# Patient Record
Sex: Female | Born: 1955 | Race: Black or African American | Hispanic: No | Smoking: Never smoker
Health system: Southern US, Community
[De-identification: ages and names within clinical notes are randomized; demographics above are authoritative.]

## PROBLEM LIST (undated history)

## (undated) DIAGNOSIS — R200 Anesthesia of skin: Secondary | ICD-10-CM

## (undated) DIAGNOSIS — K219 Gastro-esophageal reflux disease without esophagitis: Secondary | ICD-10-CM

## (undated) DIAGNOSIS — R739 Hyperglycemia, unspecified: Secondary | ICD-10-CM

## (undated) HISTORY — DX: Gastro-esophageal reflux disease without esophagitis: K21.9

## (undated) HISTORY — PX: ABDOMINAL HYSTERECTOMY: SHX81

## (undated) HISTORY — DX: Anesthesia of skin: R20.0

## (undated) HISTORY — PX: EYE SURGERY: SHX253

## (undated) HISTORY — DX: Hyperglycemia, unspecified: R73.9

---

## 2004-03-17 ENCOUNTER — Encounter: Admission: RE | Admit: 2004-03-17 | Discharge: 2004-03-17 | Payer: Self-pay | Admitting: Family Medicine

## 2005-05-05 ENCOUNTER — Encounter: Admission: RE | Admit: 2005-05-05 | Discharge: 2005-05-05 | Payer: Self-pay | Admitting: Family Medicine

## 2006-10-12 ENCOUNTER — Ambulatory Visit: Payer: Self-pay | Admitting: Gastroenterology

## 2009-06-19 ENCOUNTER — Encounter: Payer: Self-pay | Admitting: Gastroenterology

## 2009-06-25 ENCOUNTER — Encounter (INDEPENDENT_AMBULATORY_CARE_PROVIDER_SITE_OTHER): Payer: Self-pay | Admitting: *Deleted

## 2009-07-09 ENCOUNTER — Encounter (INDEPENDENT_AMBULATORY_CARE_PROVIDER_SITE_OTHER): Payer: Self-pay | Admitting: *Deleted

## 2009-07-17 ENCOUNTER — Ambulatory Visit: Payer: Self-pay | Admitting: Gastroenterology

## 2009-07-24 ENCOUNTER — Ambulatory Visit: Payer: Self-pay | Admitting: Gastroenterology

## 2009-07-25 ENCOUNTER — Encounter: Payer: Self-pay | Admitting: Gastroenterology

## 2009-08-26 ENCOUNTER — Ambulatory Visit (HOSPITAL_COMMUNITY)
Admission: RE | Admit: 2009-08-26 | Discharge: 2009-08-27 | Payer: Self-pay | Source: Home / Self Care | Admitting: Ophthalmology

## 2010-05-26 NOTE — Letter (Signed)
Summary: [facility]  [facility]   Imported By: Lester [place] 07/29/2009 10:53:26  _____________________________________________________________________  External Attachment:    Type:   Image     Comment:   External Document

## 2010-05-26 NOTE — Letter (Signed)
Summary: Previsit letter  [facility]  [address] [place], [place] [address]   Phone: [phone]  Fax: [phone]       06/25/2009 MRN: [id number]  Julia James [address] [place], [place]  [address]  Dear Ms. Grabski,  Welcome to the Gastroenterology Division at [facility].    You are scheduled to see a nurse for your pre-procedure visit on 07-10-09 at 10:00a.m. on the 3rd floor at [facility], [address]  We ask that you try to arrive at our office 15 minutes prior to your appointment time to allow for check-in.  Your nurse visit will consist of discussing your medical and surgical history, your immediate family medical history, and your medications.    Please bring a complete list of all your medications or, if you prefer, bring the medication bottles and we will list them.  We will need to be aware of both prescribed and over the counter drugs.  We will need to know exact dosage information as well.  If you are on blood thinners (Coumadin, Plavix, Aggrenox, Ticlid, etc.) please call our office today/prior to your appointment, as we need to consult with your physician about holding your medication.   Please be prepared to read and sign documents such as consent forms, a financial agreement, and acknowledgement forms.  If necessary, and with your consent, a friend or relative is welcome to sit-in on the nurse visit with you.  Please bring your insurance card so that we may make a copy of it.  If your insurance requires a referral to see a specialist, please bring your referral form from your primary care physician.  No co-pay is required for this nurse visit.     If you cannot keep your appointment, please call [phone] to cancel or reschedule prior to your appointment date.  This allows [place] the opportunity to schedule an appointment for another patient in need of care.    Thank you for choosing [facility] for your medical  needs.  We appreciate the opportunity to care for you.  Please visit [place] at our website  to learn more about our practice.                     Sincerely.                                                                                                                   The Gastroenterology Division

## 2010-05-26 NOTE — Letter (Signed)
Summary: [facility] Instructions  [facility]  [address] [place], [place] [address]   Phone: [phone]  Fax: [phone]       Julia James    Sep 11, 1955    MRN: [id number]        Procedure Day /Date:  Thursday 07/24/2009     Arrival Time:  9:30 am      Procedure Time: 10:30 am     Location of Procedure:                    _x _  [facility] (4th Floor)   PREPARATION FOR COLONOSCOPY WITH MOVIPREP   Starting 5 days prior to your procedure  Saturday 3/26 do not eat nuts, seeds, popcorn, corn, beans, peas,  salads, or any raw vegetables.  Do not take any fiber supplements (e.g. Metamucil, Citrucel, and Benefiber).  THE DAY BEFORE YOUR PROCEDURE         DATE: Wednesday 3/30  1.  Drink clear liquids the entire day-NO SOLID FOOD  2.  Do not drink anything colored red or purple.  Avoid juices with pulp.  No orange juice.  3.  Drink at least 64 oz. (8 glasses) of fluid/clear liquids during the day to prevent dehydration and help the prep work efficiently.  CLEAR LIQUIDS INCLUDE: Water Jello Ice Popsicles Tea (sugar ok, no milk/cream) Powdered fruit flavored drinks Coffee (sugar ok, no milk/cream) Gatorade Juice: apple, white grape, white cranberry  Lemonade Clear bullion, consomm, broth Carbonated beverages (any kind) Strained chicken noodle soup Hard Candy                             4.  In the morning, mix first dose of MoviPrep solution:    Empty 1 Pouch A and 1 Pouch B into the disposable container    Add lukewarm drinking water to the top line of the container. Mix to dissolve    Refrigerate (mixed solution should be used within 24 hrs)  5.  Begin drinking the prep at 5:00 p.m. The MoviPrep container is divided by 4 marks.   Every 15 minutes drink the solution down to the next mark (approximately 8 oz) until the full liter is complete.   6.  Follow completed prep with 16 oz of clear liquid of your choice (Nothing red or  purple).  Continue to drink clear liquids until bedtime.  7.  Before going to bed, mix second dose of MoviPrep solution:    Empty 1 Pouch A and 1 Pouch B into the disposable container    Add lukewarm drinking water to the top line of the container. Mix to dissolve    Refrigerate  THE DAY OF YOUR PROCEDURE      DATE:Thursday 3/31  Beginning at 5:30 a.m. (5 hours before procedure):         1. Every 15 minutes, drink the solution down to the next mark (approx 8 oz) until the full liter is complete.  2. Follow completed prep with 16 oz. of clear liquid of your choice.    3. You may drink clear liquids until 8:30 am (2 HOURS BEFORE PROCEDURE).   MEDICATION INSTRUCTIONS  Unless otherwise instructed, you should take regular prescription medications with a small sip of water   as early as possible the morning of your procedure.         OTHER INSTRUCTIONS  You will need a responsible adult at  least 54 years of age to accompany you and drive you home.   This person must remain in the waiting room during your procedure.  Wear loose fitting clothing that is easily removed.  Leave jewelry and other valuables at home.  However, you may wish to bring a book to read or  an iPod/MP3 player to listen to music as you wait for your procedure to start.  Remove all body piercing jewelry and leave at home.  Total time from sign-in until discharge is approximately 2-3 hours.  You should go home directly after your procedure and rest.  You can resume normal activities the  day after your procedure.  The day of your procedure you should not:   Drive   Make legal decisions   Operate machinery   Drink alcohol   Return to work  You will receive specific instructions about eating, activities and medications before you leave.    The above instructions have been reviewed and explained to me by   ____Suzanne Yetta Flock, RN___________________    I fully understand and can verbalize  these instructions _____________________________ Date _________

## 2010-05-26 NOTE — Miscellaneous (Signed)
Summary: screening colon/ks  Clinical Lists Changes  Medications: Added new medication of MOVIPREP 100 GM  SOLR (PEG-KCL-NACL-NASULF-NA ASC-C) As directed - Signed Rx of MOVIPREP 100 GM  SOLR (PEG-KCL-NACL-NASULF-NA ASC-C) As directed;  #1 x 0;  Signed;  Entered by: Clide Cliff RN;  Authorized by: Louis Meckel MD;  Method used: Electronically to [facility]  [address] [id number] [address] [place], [place]  [address], Ph: [phone] or [phone], Fax: [phone] Observations: Added new observation of ALLERGY REV: Done (07/17/2009 9:11)    Prescriptions: MOVIPREP 100 GM  SOLR (PEG-KCL-NACL-NASULF-NA ASC-C) As directed  #1 x 0   Entered by:   Clide Cliff RN   Authorized by:   Louis Meckel MD   Signed by:   Clide Cliff RN on 07/17/2009   Method used:   Electronically to        [facility]  [address] [id number] ([facility]ail)       [address]       [place], [place]  [address]       Ph: [phone] or [phone]       Fax: [phone]   RxID:   [id number]

## 2010-05-26 NOTE — Procedures (Signed)
Summary: Colonoscopy  Patient: Julia James Note: All result statuses are Final unless otherwise noted.  Tests: (1) Colonoscopy (COL)   COL Colonoscopy           DONE     [facility]     [address]     [place], [place]  [address]           COLONOSCOPY PROCEDURE REPORT           PATIENT:  [name], [name]  MR#:  [id number]     BIRTHDATE:  [birth year], 54 yrs. old  GENDER:  female     ENDOSCOPIST:  Barbette Hair. Arlyce Dice, MD     REF. BY:     PROCEDURE DATE:  07/24/2009     PROCEDURE:  Colonoscopy with snare polypectomy     ASA CLASS:  Class I     INDICATIONS:  Routine Risk Screening     MEDICATIONS:   Fentanyl 100 mcg IV, Versed 10 mg IV           DESCRIPTION OF PROCEDURE:   After the risks benefits and     alternatives of the procedure were thoroughly explained, informed     consent was obtained.  Digital rectal exam was performed and     revealed no abnormalities.   The LB CF-[id number] endoscope     was introduced through the anus and advanced to the cecum, which     was identified by the ileocecal valve, without limitations.  The     quality of the prep was excellent, using MoviPrep.  The instrument     was then slowly withdrawn as the colon was fully examined.     <<PROCEDUREIMAGES>>           FINDINGS:  A sessile polyp was found in the recto-sigmoid colon.     It was 3 mm in size. It was found 9 cm from the point of entry.     Polyp was snared without cautery. Retrieval was unsuccessful.     snare polyp Unable to locate snared polyp. Base of polyp was bxed     and submitted to pathology (see image15). Mucosal surface of polyp     had features of an adenomatous polyp  This was otherwise a normal     examination of the colon (see image1, image2, image3, image5,     image6, image7, image8, image10, image13, and image16).     Retroflexed views in the rectum revealed no abnormalities.    The     scope was then withdrawn from the patient and the procedure  completed.           COMPLICATIONS:  None     ENDOSCOPIC IMPRESSION:     1) 3 mm sessile polyp in the recto-sigmoid colon     2) Otherwise normal examination     RECOMMENDATIONS:     1) colonoscopy in 5 years     REPEAT EXAM:  In 5 year(s) for Colonoscopy.           ______________________________     Barbette Hair. Arlyce Dice, MD           CC:  Charlesetta Shanks, Md           n.     eSIGNED:   Barbette Hair. [name] at 07/24/2009 11:24 AM           [name], [id number]  Note: An exclamation mark (!) indicates a result that was not dispersed into  the flowsheet. Document Creation Date: 07/24/2009 11:24 AM _______________________________________________________________________  (1) Order result status: Final Collection or observation date-time: 07/24/2009 11:17 Requested date-time:  Receipt date-time:  Reported date-time:  Referring Physician:   Ordering Physician: Melvia Heaps [phone]) Specimen Source:  Source: [name] Order Number: [id number] Lab site:   Appended Document: Colonoscopy     Procedures Next Due Date:    Colonoscopy: 06/2014

## 2010-05-26 NOTE — Letter (Signed)
Summary: Results Letter  [facility]  [address] [place], [place] [address]   Phone: [phone]  Fax: [phone]        July 25, 2009 MRN: [id number]    LASHONE STAUBER [address] [place], [place]  [address]    Dear Ms. Heart,  As you may recall, colonoscopy revealed 2 small polyps.  These are noncancerous polyps.  I recommend that you repeat this exam in 5 years.  Please feel free to contact me if you have any questions.  Sincerely,  Barbette Hair. Arlyce Dice, M.D., [facility]         Sincerely,  Louis Meckel MD  This letter has been electronically signed by your physician.  Appended Document: Results Letter Letter mailed 4.5.11

## 2010-07-14 LAB — CBC
HCT: 33.9 % — ABNORMAL LOW (ref 36.0–46.0)
Hemoglobin: 11.5 g/dL — ABNORMAL LOW (ref 12.0–15.0)
MCHC: 33.9 g/dL (ref 30.0–36.0)
MCV: 81.5 fL (ref 78.0–100.0)
Platelets: 142 10*3/uL — ABNORMAL LOW (ref 150–400)
RBC: 4.17 MIL/uL (ref 3.87–5.11)
RDW: 13.8 % (ref 11.5–15.5)
WBC: 4 10*3/uL (ref 4.0–10.5)

## 2010-11-24 ENCOUNTER — Emergency Department (HOSPITAL_COMMUNITY)
Admission: EM | Admit: 2010-11-24 | Discharge: 2010-11-24 | Disposition: A | Discharge status: Home / Self Care | Payer: Worker's Compensation | Attending: Emergency Medicine | Admitting: Emergency Medicine

## 2010-11-24 DIAGNOSIS — S0083XA Contusion of other part of head, initial encounter: Secondary | ICD-10-CM | POA: Insufficient documentation

## 2010-11-24 DIAGNOSIS — Y9210 Unspecified residential institution as the place of occurrence of the external cause: Secondary | ICD-10-CM | POA: Insufficient documentation

## 2010-11-24 DIAGNOSIS — R51 Headache: Secondary | ICD-10-CM | POA: Insufficient documentation

## 2010-11-24 DIAGNOSIS — S0003XA Contusion of scalp, initial encounter: Secondary | ICD-10-CM | POA: Insufficient documentation

## 2010-11-24 DIAGNOSIS — Y990 Civilian activity done for income or pay: Secondary | ICD-10-CM | POA: Insufficient documentation

## 2011-05-21 ENCOUNTER — Encounter (INDEPENDENT_AMBULATORY_CARE_PROVIDER_SITE_OTHER): Payer: Worker's Compensation | Admitting: Ophthalmology

## 2011-06-08 ENCOUNTER — Ambulatory Visit (INDEPENDENT_AMBULATORY_CARE_PROVIDER_SITE_OTHER): Payer: BC Managed Care – PPO | Admitting: Family Medicine

## 2011-06-08 ENCOUNTER — Ambulatory Visit (INDEPENDENT_AMBULATORY_CARE_PROVIDER_SITE_OTHER): Payer: BC Managed Care – PPO | Admitting: Ophthalmology

## 2011-06-08 ENCOUNTER — Encounter: Payer: Self-pay | Admitting: Family Medicine

## 2011-06-08 DIAGNOSIS — H33009 Unspecified retinal detachment with retinal break, unspecified eye: Secondary | ICD-10-CM

## 2011-06-08 DIAGNOSIS — H2510 Age-related nuclear cataract, unspecified eye: Secondary | ICD-10-CM

## 2011-06-08 DIAGNOSIS — H538 Other visual disturbances: Secondary | ICD-10-CM

## 2011-06-08 DIAGNOSIS — J069 Acute upper respiratory infection, unspecified: Secondary | ICD-10-CM

## 2011-06-08 DIAGNOSIS — H43819 Vitreous degeneration, unspecified eye: Secondary | ICD-10-CM

## 2011-06-08 DIAGNOSIS — H66009 Acute suppurative otitis media without spontaneous rupture of ear drum, unspecified ear: Secondary | ICD-10-CM

## 2011-06-08 DIAGNOSIS — H6690 Otitis media, unspecified, unspecified ear: Secondary | ICD-10-CM

## 2011-06-08 MED ORDER — FLUTICASONE PROPIONATE 50 MCG/ACT NA SUSP: NASAL | Status: DC

## 2011-06-08 MED ORDER — AMOXICILLIN 500 MG PO CAPS: 500.0000 mg | ORAL_CAPSULE | Freq: Three times a day (TID) | ORAL | Status: AC

## 2011-06-08 NOTE — Progress Notes (Signed)
  Subjective:    Patient ID: Julia James, female    DOB: June 08, 1955, 56 y.o.   MRN: [id number]  HPI Patient works at [employer] home. She has been getting ill for the last couple of days. She has facial pain and headache and congestion. She has a little pain going into her left ear now. There's a slight cough but it is not bad. No abdominal complaints.   Review of Systems Unremarkable    Objective:   Physical Exam Middle-aged [place] female who looks like she just doesn't feel well, looks fatigued. TMs normal on the right but the left has a little injection in the lower anterior third of the TM. Her throat was clear nose congested. Has a little tenderness of her sinuses. Neck supple without significant nodes. Chest is clear to auscultation. Heart regular without murmurs.       Assessment & Plan:  URI with early left otitis and headache  See the written discharge instruction sheet

## 2011-06-08 NOTE — Patient Instructions (Signed)
Patient is encouraged to drink lots of fluids and get enough rest. She can take Tylenol or aspirin for aching and headache. Am prescribing a nose spray to try and open up the sinuses and eustachian tubes, as well as an antibiotic for her. She is to let me know if she is not doing better. We will keep her off of work tomorrow   Upper Respiratory Infection, Adult An upper respiratory infection (URI) is also known as the common cold. It is often caused by a type of germ (virus). Colds are easily spread (contagious). You can pass it to others by kissing, coughing, sneezing, or drinking out of the same glass. Usually, you get better in 1 or 2 weeks.  HOME CARE   Only take medicine as told by your doctor.   Use a warm mist humidifier or breathe in steam from a hot shower.   Drink enough water and fluids to keep your pee (urine) clear or pale yellow.   Get plenty of rest.   Return to work when your temperature is back to normal or as told by your doctor. You may use a face mask and wash your hands to stop your cold from spreading.  GET HELP RIGHT AWAY IF:   After the first few days, you feel you are getting worse.   You have questions about your medicine.   You have chills, shortness of breath, or brown or red spit (mucus).   You have yellow or brown snot (nasal discharge) or pain in the face, especially when you bend forward.   You have a fever, puffy (swollen) neck, pain when you swallow, or white spots in the back of your throat.   You have a bad headache, ear pain, sinus pain, or chest pain.   You have a high-pitched whistling sound when you breathe in and out (wheezing).   You have a lasting cough or cough up blood.   You have sore muscles or a stiff neck.  MAKE SURE YOU:   Understand these instructions.   Will watch your condition.   Will get help right away if you are not doing well or get worse.  Document Released: 09/29/2007 Document Revised: 12/23/2010 Document Reviewed:  08/17/2010 [facility] [place] [facility] Patient Information 2012 [place], [place].

## 2011-12-07 ENCOUNTER — Ambulatory Visit (INDEPENDENT_AMBULATORY_CARE_PROVIDER_SITE_OTHER): Payer: Worker's Compensation | Admitting: Ophthalmology

## 2011-12-14 ENCOUNTER — Ambulatory Visit (INDEPENDENT_AMBULATORY_CARE_PROVIDER_SITE_OTHER): Payer: Worker's Compensation | Admitting: Ophthalmology

## 2011-12-30 ENCOUNTER — Ambulatory Visit (INDEPENDENT_AMBULATORY_CARE_PROVIDER_SITE_OTHER): Payer: Worker's Compensation | Admitting: Ophthalmology

## 2012-07-31 ENCOUNTER — Encounter (INDEPENDENT_AMBULATORY_CARE_PROVIDER_SITE_OTHER): Payer: BC Managed Care – PPO | Admitting: Ophthalmology

## 2012-07-31 DIAGNOSIS — H209 Unspecified iridocyclitis: Secondary | ICD-10-CM

## 2012-07-31 DIAGNOSIS — H538 Other visual disturbances: Secondary | ICD-10-CM

## 2012-07-31 DIAGNOSIS — H43819 Vitreous degeneration, unspecified eye: Secondary | ICD-10-CM

## 2012-07-31 DIAGNOSIS — H33009 Unspecified retinal detachment with retinal break, unspecified eye: Secondary | ICD-10-CM

## 2014-07-05 ENCOUNTER — Encounter: Payer: Self-pay | Admitting: Gastroenterology

## 2015-08-29 ENCOUNTER — Encounter: Payer: Self-pay | Admitting: Gastroenterology

## 2016-08-17 DIAGNOSIS — M722 Plantar fascial fibromatosis: Secondary | ICD-10-CM | POA: Diagnosis not present

## 2016-08-17 DIAGNOSIS — J069 Acute upper respiratory infection, unspecified: Secondary | ICD-10-CM | POA: Diagnosis not present

## 2016-08-31 DIAGNOSIS — M79671 Pain in right foot: Secondary | ICD-10-CM | POA: Diagnosis not present

## 2016-08-31 DIAGNOSIS — K210 Gastro-esophageal reflux disease with esophagitis: Secondary | ICD-10-CM | POA: Diagnosis not present

## 2016-08-31 DIAGNOSIS — K5900 Constipation, unspecified: Secondary | ICD-10-CM | POA: Diagnosis not present

## 2016-08-31 DIAGNOSIS — Z5181 Encounter for therapeutic drug level monitoring: Secondary | ICD-10-CM | POA: Diagnosis not present

## 2016-08-31 DIAGNOSIS — R739 Hyperglycemia, unspecified: Secondary | ICD-10-CM | POA: Diagnosis not present

## 2016-11-11 DIAGNOSIS — Z1231 Encounter for screening mammogram for malignant neoplasm of breast: Secondary | ICD-10-CM | POA: Diagnosis not present

## 2017-03-04 DIAGNOSIS — J209 Acute bronchitis, unspecified: Secondary | ICD-10-CM | POA: Diagnosis not present

## 2018-05-01 DIAGNOSIS — J069 Acute upper respiratory infection, unspecified: Secondary | ICD-10-CM | POA: Diagnosis not present

## 2018-05-05 DIAGNOSIS — Z1231 Encounter for screening mammogram for malignant neoplasm of breast: Secondary | ICD-10-CM | POA: Diagnosis not present

## 2018-05-08 DIAGNOSIS — Z0000 Encounter for general adult medical examination without abnormal findings: Secondary | ICD-10-CM | POA: Diagnosis not present

## 2018-05-08 DIAGNOSIS — Z136 Encounter for screening for cardiovascular disorders: Secondary | ICD-10-CM | POA: Diagnosis not present

## 2018-05-08 DIAGNOSIS — Z5181 Encounter for therapeutic drug level monitoring: Secondary | ICD-10-CM | POA: Diagnosis not present

## 2018-05-08 DIAGNOSIS — R739 Hyperglycemia, unspecified: Secondary | ICD-10-CM | POA: Diagnosis not present

## 2019-06-04 ENCOUNTER — Other Ambulatory Visit (HOSPITAL_BASED_OUTPATIENT_CLINIC_OR_DEPARTMENT_OTHER): Payer: Self-pay | Admitting: Family Medicine

## 2019-06-04 ENCOUNTER — Ambulatory Visit (HOSPITAL_BASED_OUTPATIENT_CLINIC_OR_DEPARTMENT_OTHER): Admission: RE | Admit: 2019-06-04 | Payer: PRIVATE HEALTH INSURANCE | Source: Ambulatory Visit

## 2019-06-04 DIAGNOSIS — M79605 Pain in left leg: Secondary | ICD-10-CM

## 2019-06-05 ENCOUNTER — Ambulatory Visit (HOSPITAL_BASED_OUTPATIENT_CLINIC_OR_DEPARTMENT_OTHER)
Admission: RE | Admit: 2019-06-05 | Discharge: 2019-06-05 | Disposition: A | Discharge status: Home / Self Care | Payer: PRIVATE HEALTH INSURANCE | Source: Ambulatory Visit | Attending: Family Medicine | Admitting: Family Medicine

## 2019-06-05 ENCOUNTER — Other Ambulatory Visit: Payer: Self-pay

## 2019-06-05 ENCOUNTER — Encounter (HOSPITAL_BASED_OUTPATIENT_CLINIC_OR_DEPARTMENT_OTHER): Payer: Self-pay

## 2019-06-05 ENCOUNTER — Other Ambulatory Visit (HOSPITAL_BASED_OUTPATIENT_CLINIC_OR_DEPARTMENT_OTHER): Payer: Self-pay | Admitting: Family Medicine

## 2019-06-05 DIAGNOSIS — M79605 Pain in left leg: Secondary | ICD-10-CM | POA: Insufficient documentation

## 2019-06-05 DIAGNOSIS — Z1231 Encounter for screening mammogram for malignant neoplasm of breast: Secondary | ICD-10-CM | POA: Diagnosis present

## 2019-10-10 ENCOUNTER — Other Ambulatory Visit: Payer: Self-pay

## 2019-10-10 ENCOUNTER — Encounter: Payer: Self-pay | Admitting: Neurology

## 2019-10-10 ENCOUNTER — Ambulatory Visit: Payer: PRIVATE HEALTH INSURANCE | Admitting: Neurology

## 2019-10-10 ENCOUNTER — Telehealth: Payer: Self-pay | Admitting: Neurology

## 2019-10-10 VITALS — BP 143/83 | HR 62 | Ht 62.0 in | Wt 229.5 lb

## 2019-10-10 DIAGNOSIS — M5442 Lumbago with sciatica, left side: Secondary | ICD-10-CM | POA: Diagnosis not present

## 2019-10-10 DIAGNOSIS — M5441 Lumbago with sciatica, right side: Secondary | ICD-10-CM | POA: Diagnosis not present

## 2019-10-10 DIAGNOSIS — R202 Paresthesia of skin: Secondary | ICD-10-CM | POA: Diagnosis not present

## 2019-10-10 MED ORDER — DULOXETINE HCL 60 MG PO CPEP: 60.0000 mg | ORAL_CAPSULE | Freq: Every day | ORAL | 11 refills | Status: DC

## 2019-10-10 NOTE — Progress Notes (Addendum)
PATIENT: Julia James DOB: 1955-06-01  Chief Complaint  Patient presents with  . Numbness    Reports intermittent numbness/pain in her bilateral legs and feet. Symptoms starting in February 2020. She is taking gabapentin 300mg  at bedtime without much benefit.  PCP    Marland Kitchen, MD     HISTORICAL  Julia James is a 64 year old female, seen in request by her primary care physician Dr. 64, [place] Charon P for evaluation of bilateral feet paresthesia  I reviewed and summarized the referring note. She has past medical history of acid reflux, prediabetes, obesity,  Since February 2021, she noticed gradual onset bilateral feet numbness tingling, starting at the plantar surface, quickly ascending to bilateral ankle level, sometimes to knee level, she described numbness tingling, occasionally woke her up from sleep, she denies bowel and bladder incontinence, has chronic pain, but no significant radiating pain,  She also complains of bilateral hands paresthesia, was seen by hand surgeon, was given the diagnosis of possible carpal tunnel syndromes   REVIEW OF SYSTEMS: Full 14 system review of systems performed and notable only for as above All other review of systems were negative.  ALLERGIES: No Known Allergies  HOME MEDICATIONS: Current Outpatient Medications  Medication Sig Dispense Refill  . aspirin EC 81 MG tablet Take 81 mg by mouth as needed.     . gabapentin (NEURONTIN) 300 MG capsule Take 300 mg by mouth at bedtime.    . meloxicam (MOBIC) 15 MG tablet Take 15 mg by mouth as needed for pain.     No current facility-administered medications for this visit.    PAST MEDICAL HISTORY: Past Medical History:  Diagnosis Date  . GERD (gastroesophageal reflux disease)   . Hyperglycemia   . Numbness     PAST SURGICAL HISTORY: Past Surgical History:  Procedure Laterality Date  . ABDOMINAL HYSTERECTOMY    . EYE SURGERY Left     FAMILY HISTORY: Family  History  Problem Relation Age of Onset  . Healthy Mother   . Hypertension Father   . Diabetes Father     SOCIAL HISTORY: Social History   Socioeconomic History  . Marital status: Widowed    Spouse name: Not on file  . Number of children: 3  . Years of education: college  . Highest education level: Not on file  Occupational History  . Occupation: CMA  Tobacco Use  . Smoking status: Never Smoker  . Smokeless tobacco: Never Used  Substance and Sexual Activity  . Alcohol use: No  . Drug use: No  . Sexual activity: Not on file  Other Topics Concern  . Not on file  Social History Narrative   Lives with her daughter.   Right-handed.   3-4 cups coffee/tea per day.   Social Determinants of Health   Financial Resource Strain:   . Difficulty of Paying Living Expenses:   Food Insecurity:   . Worried About 06-17-1984 in the Last Year:   . Programme researcher, broadcasting/film/video in the Last Year:   Transportation Needs:   . Barista (Medical):   Freight forwarder Lack of Transportation (Non-Medical):   Physical Activity:   . Days of Exercise per Week:   . Minutes of Exercise per Session:   Stress:   . Feeling of Stress :   Social Connections:   . Frequency of Communication with Friends and Family:   . Frequency of Social Gatherings with Friends and Family:   . Attends  Religious Services:   . Active Member of Clubs or Organizations:   . Attends Archivist Meetings:   Marland Kitchen Marital Status:   Intimate Partner Violence:   . Fear of Current or Ex-Partner:   . Emotionally Abused:   Marland Kitchen Physically Abused:   . Sexually Abused:      PHYSICAL EXAM   Vitals:   10/10/19 0840  BP: (!) 143/83  Pulse: 62  Weight: 229 lb 8 oz (104.1 kg)  Height: 5\' 2"  (1.575 m)   Not recorded     Body mass index is 41.98 kg/m.  PHYSICAL EXAMNIATION:  Gen: NAD, conversant, well nourised, well groomed                     Cardiovascular: Regular rate rhythm, no peripheral edema, warm,  nontender. Eyes: Conjunctivae clear without exudates or hemorrhage Neck: Supple, no carotid bruits. Pulmonary: Clear to auscultation bilaterally   NEUROLOGICAL EXAM:  MENTAL STATUS: Speech:    Speech is normal; fluent and spontaneous with normal comprehension.  Cognition:     Orientation to time, place and person     Normal recent and remote memory     Normal Attention span and concentration     Normal Language, naming, repeating,spontaneous speech     Fund of knowledge   CRANIAL NERVES: CN II: Visual fields are full to confrontation. Pupils are round equal and briskly reactive to light. CN III, IV, VI: extraocular movement are normal. No ptosis. CN V: Facial sensation is intact to light touch CN VII: Face is symmetric with normal eye closure  CN VIII: Hearing is normal to causal conversation. CN IX, X: Phonation is normal. CN XI: Head turning and shoulder shrug are intact  MOTOR: There is no pronator drift of out-stretched arms. Muscle bulk and tone are normal. Muscle strength is normal.  REFLEXES: Reflexes are 2+ and symmetric at the biceps, triceps, knees, and trace at ankles. Plantar responses are flexor.  SENSORY: Intact to light touch, pinprick and vibratory sensation are intact in fingers and toes.  COORDINATION: There is no trunk or limb dysmetria noted.  GAIT/STANCE: Need push-up to get up from seated position, antalgic, cautious, Romberg is absent.   DIAGNOSTIC DATA (LABS, IMAGING, TESTING) - I reviewed patient records, labs, notes, testing and imaging myself where available.   ASSESSMENT AND PLAN  Julia James is a 64 y.o. female    64 year old female presented with gradual onset bilateral lower extremity ascending paresthesia, and bilateral hands paresthesia Differentiation diagnosis include peripheral neuropathy, with superimposed bilateral carpal tunnel syndromes versus bilateral lumbosacral radiculopathy MRI of lumbar spine to rule out lumbar  stenosis Laboratory evaluation for peripheral neuropathy EMG nerve conduction study  Marcial Pacas, M.D. Ph.D.  [facility] [address] [place], [place] [address] Ph: [phone] Fax: [phone]  CC: Vernie Shanks, MD  Referring Physician

## 2019-10-10 NOTE — Telephone Encounter (Signed)
medcost order sent to GI. They will obtain the auth and reach out to the patient to schedule.  

## 2019-10-15 ENCOUNTER — Telehealth: Payer: Self-pay | Admitting: Neurology

## 2019-10-15 LAB — ANA W/REFLEX IF POSITIVE
Anti JO-1: <0.2 AI (ref 0.0–0.9)
Anti Nuclear Antibody (ANA): POSITIVE — AB
Centromere Ab Screen: <0.2 AI (ref 0.0–0.9)
Chromatin Ab SerPl-aCnc: <0.2 AI (ref 0.0–0.9)
ENA RNP Ab: 0.6 AI (ref 0.0–0.9)
ENA SM Ab Ser-aCnc: <0.2 AI (ref 0.0–0.9)
ENA SSA (RO) Ab: 7.1 AI — ABNORMAL HIGH (ref 0.0–0.9)
ENA SSB (LA) Ab: <0.2 AI (ref 0.0–0.9)
Scleroderma (Scl-70) (ENA) Antibody, IgG: <0.2 AI (ref 0.0–0.9)
dsDNA Ab: <1 IU/mL (ref 0–9)

## 2019-10-15 LAB — MULTIPLE MYELOMA PANEL, SERUM
Albumin SerPl Elph-Mcnc: 3.6 g/dL (ref 2.9–4.4)
Albumin/Glob SerPl: 0.8 (ref 0.7–1.7)
Alpha 1: 0.3 g/dL (ref 0.0–0.4)
Alpha2 Glob SerPl Elph-Mcnc: 0.8 g/dL (ref 0.4–1.0)
B-Globulin SerPl Elph-Mcnc: 1.2 g/dL (ref 0.7–1.3)
Gamma Glob SerPl Elph-Mcnc: 2.4 g/dL — ABNORMAL HIGH (ref 0.4–1.8)
Globulin, Total: 4.7 g/dL — ABNORMAL HIGH (ref 2.2–3.9)
IgA/Immunoglobulin A, Serum: 363 mg/dL — ABNORMAL HIGH (ref 87–352)
IgG (Immunoglobin G), Serum: 2922 mg/dL — ABNORMAL HIGH (ref 586–1602)
IgM (Immunoglobulin M), Srm: 60 mg/dL (ref 26–217)
Total Protein: 8.3 g/dL (ref 6.0–8.5)

## 2019-10-15 LAB — FERRITIN: Ferritin: 247 ng/mL — ABNORMAL HIGH (ref 15–150)

## 2019-10-15 LAB — HGB A1C W/O EAG: Hgb A1c MFr Bld: 6 % — ABNORMAL HIGH (ref 4.8–5.6)

## 2019-10-15 LAB — VITAMIN B12: Vitamin B-12: 970 pg/mL (ref 232–1245)

## 2019-10-15 LAB — SEDIMENTATION RATE: Sed Rate: 85 mm/hr — ABNORMAL HIGH (ref 0–40)

## 2019-10-15 LAB — C-REACTIVE PROTEIN: CRP: 4 mg/L (ref 0–10)

## 2019-10-15 LAB — FOLATE: Folate: 11.1 ng/mL (ref >3.0)

## 2019-10-15 LAB — VITAMIN D 25 HYDROXY (VIT D DEFICIENCY, FRACTURES): Vit D, 25-Hydroxy: 20.8 ng/mL — ABNORMAL LOW (ref 30.0–100.0)

## 2019-10-15 LAB — CK: Total CK: 241 U/L — ABNORMAL HIGH (ref 32–182)

## 2019-10-15 NOTE — Telephone Encounter (Signed)
Please call patient, laboratory evaluation showed  Vitamin D deficiency, level of 20.8, she should start vitamin D3 supplement 1000 units daily  Elevated ferritin 247, ESR of 85, which usually indicating systemic inflammatory process, but in the setting of normal C-reactive protein 4, above findings has unknown clinical significance, may consider repeat laboratory evaluation later.  There is also evidence of positive ANA, with positive SSA, which usually is correlated with Sjogren's disease, ask her if she has any signs of dry eye, dry mouth, if she does, may consider referring to rheumatologist for evaluation  A1c of 6.1, indicating increased average glucose level over the past few months, she stopped started by diet and exercise

## 2019-10-16 NOTE — Telephone Encounter (Signed)
I was able to speak to the patient this afternoon. She verbalized understanding of her lab results.  1) She will start the recommended vitamin D3. 2) She denies any symptoms of dry eyes or mouth. 3) She will start watching her diet and try to exercise.

## 2019-10-16 NOTE — Telephone Encounter (Signed)
I left second message for a return call.

## 2019-10-16 NOTE — Telephone Encounter (Signed)
Left message requesting a return call.

## 2019-10-22 ENCOUNTER — Encounter: Payer: PRIVATE HEALTH INSURANCE | Admitting: Neurology

## 2019-11-04 ENCOUNTER — Other Ambulatory Visit: Payer: Self-pay | Admitting: Neurology

## 2019-11-14 ENCOUNTER — Ambulatory Visit (INDEPENDENT_AMBULATORY_CARE_PROVIDER_SITE_OTHER): Payer: PRIVATE HEALTH INSURANCE | Admitting: Neurology

## 2019-11-14 ENCOUNTER — Other Ambulatory Visit: Payer: Self-pay

## 2019-11-14 ENCOUNTER — Ambulatory Visit: Payer: PRIVATE HEALTH INSURANCE | Admitting: Neurology

## 2019-11-14 ENCOUNTER — Telehealth: Payer: Self-pay | Admitting: Neurology

## 2019-11-14 DIAGNOSIS — G5603 Carpal tunnel syndrome, bilateral upper limbs: Secondary | ICD-10-CM

## 2019-11-14 DIAGNOSIS — R202 Paresthesia of skin: Secondary | ICD-10-CM | POA: Diagnosis not present

## 2019-11-14 DIAGNOSIS — M5441 Lumbago with sciatica, right side: Secondary | ICD-10-CM

## 2019-11-14 DIAGNOSIS — M5442 Lumbago with sciatica, left side: Secondary | ICD-10-CM

## 2019-11-14 MED ORDER — PREGABALIN 100 MG PO CAPS: 100.0000 mg | ORAL_CAPSULE | Freq: Three times a day (TID) | ORAL | 5 refills | Status: DC

## 2019-11-14 NOTE — Telephone Encounter (Signed)
Please make sure she is on schedule for MRI lumbar as ordered.

## 2019-11-14 NOTE — Procedures (Signed)
Full Name: Julia James Gender: Female MRN #: [id number] Date of Birth: 1955-11-11    Visit Date: 11/14/2019 07:09 Age: 64 Years Examining Physician: Levert Feinstein, MD  Referring Physician: Levert Feinstein, MD Height: 5 feet 2 inch History: 64 year old female complaining of diffuse body achy pain, intermittent paresthesia  Summary of the tests:  Nerve conduction study:  Bilateral sural, superficial peroneal sensory responses were normal.  Bilateral peroneal to EDB motor responses were normal.  Bilateral tibial motor responses has decreased CMAP amplitude, most likely due to suboptimal stimulation, she could not tolerate longer duration stimulation, has fatty ankles,  Bilateral median sensory responses showed mildly prolonged peak latency with mildly decreased snap amplitude.  Right median motor responses were normal.  Electromyography: Selected needle examination of right lower extremity muscles was normal, she could not tolerate needle examination of paraspinal muscles.   Conclusion: This is an abnormal study.  There is electrodiagnostic evidence of mild bilateral median neuropathy across the wrist consistent with mild bilateral carpal tunnel syndromes.  There is no evidence of large fiber peripheral neuropathy or active right lumbosacral radiculopathy.    ------------------------------- Levert Feinstein, M.D. PhD  [facility] [address] [place], [place] [address] Tel: [phone] Fax: [phone]  Verbal informed consent was obtained from the patient, patient was informed of potential risk of procedure, including bruising, bleeding, hematoma formation, infection, muscle weakness, muscle pain, numbness, among others.         MNC    Nerve / Sites Muscle Latency Ref. Amplitude Ref. Rel Amp Segments Distance Velocity Ref. Area    ms ms mV mV %  cm m/s m/s mVms  R Median - APB     Wrist APB 4.1 ?4.4 7.6 ?4.0 100 Wrist - APB 7   25.3     Upper arm APB 8.6   6.3  82.7 Upper arm - Wrist 22 49 ?49 24.0  L Peroneal - EDB     Ankle EDB 5.2 ?6.5 3.9 ?2.0 100 Ankle - EDB 9   9.3     Fib head EDB 10.3  3.3  85.7 Fib head - Ankle 25 49 ?44 7.8     Pop fossa EDB 12.3  3.5  104 Pop fossa - Fib head 10 49 ?44 8.7         Pop fossa - Ankle      R Peroneal - EDB     Ankle EDB 4.6 ?6.5 3.7 ?2.0 100 Ankle - EDB 9   10.9     Fib head EDB 10.0  3.3  90.2 Fib head - Ankle 24 44 ?44 10.5     Pop fossa EDB 12.2  3.2  98 Pop fossa - Fib head 10 47 ?44 10.5         Pop fossa - Ankle      L Tibial - AH     Ankle AH 4.2 ?5.8 3.3 ?4.0 100 Ankle - AH 9   9.3  R Tibial - AH     Ankle AH 5.7 ?5.8 1.9 ?4.0 100 Ankle - AH 9   4.5                SNC    Nerve / Sites Rec. Site Peak Lat Ref.  Amp Ref. Segments Distance    ms ms V V  cm  L Sural - Ankle (Calf)     Calf Ankle 3.4 ?4.4 6 ?6 Calf - Ankle 14  R Sural -  Ankle (Calf)     Calf Ankle 2.5 ?4.4 7 ?6 Calf - Ankle 14  L Superficial peroneal - Ankle     Lat leg Ankle 3.0 ?4.4 7 ?6 Lat leg - Ankle 14  R Superficial peroneal - Ankle     Lat leg Ankle 3.3 ?4.4 7 ?6 Lat leg - Ankle 14  R Median - Orthodromic (Dig II, Mid palm)     Dig II Wrist 3.5 ?3.4 7 ?10 Dig II - Wrist 13  L Median - Orthodromic (Dig II, Mid palm)     Dig II Wrist 3.8 ?3.4 7 ?10 Dig II - Wrist 13  R Ulnar - Orthodromic, (Dig V, Mid palm)     Dig V Wrist 3.0 ?3.1 5 ?5 Dig V - Wrist 73                   F  Wave    Nerve F Lat Ref.   ms ms  L Tibial - AH 52.1 ?56.0  R Tibial - AH 52.9 ?56.0         EMG Summary Table    Spontaneous MUAP Recruitment  Muscle IA Fib PSW Fasc Other Amp Dur. Poly Pattern  R. Tibialis anterior Normal None None None _______ Normal Normal Normal Normal  R. Tibialis posterior Normal None None None _______ Normal Normal Normal Normal  R. Peroneus longus Normal None None None _______ Normal Normal Normal Normal  R. Gastrocnemius (Medial head) Normal None None None _______ Normal Normal Normal Normal  R. Vastus  lateralis Normal None None None _______ Normal Normal Normal Normal

## 2019-11-14 NOTE — Patient Instructions (Signed)
Stop Gabapentin. 

## 2019-11-14 NOTE — Progress Notes (Signed)
PATIENT: Julia James DOB: 1955/09/15  Chief Complaint  Patient presents with  . Numbness    Reports intermittent numbness/pain in her bilateral legs and feet. Symptoms starting in February 2020. She is taking gabapentin 39m at bedtime without much benefit.  .Marland KitchenPCP    WVernie Shanks MD     HDenmarkis a 64year old female, seen in request by her primary care physician Dr. WJacelyn Grip FDub MikesP for evaluation of bilateral feet paresthesia  I reviewed and summarized the referring note. She has past medical history of acid reflux, prediabetes, obesity,  Since February 2021, she noticed gradual onset bilateral feet numbness tingling, starting at the plantar surface, quickly ascending to bilateral ankle level, sometimes to knee level, she described numbness tingling, occasionally woke her up from sleep, she denies bowel and bladder incontinence, has chronic pain, but no significant radiating pain,  She also complains of bilateral hands paresthesia, was seen by hand surgeon, was given the diagnosis of possible carpal tunnel syndromes  UPDATE November 14 2019: She return for electrodiagnostic study today, which confirmed mild bilateral carpal tunnel syndromes, there was no evidence of large fiber peripheral neuropathy, or right lumbosacral radiculopathy   she complains of intermittent diffuse body achy pain, especially after bearing weight,  Laboratory evaluation showed significantly elevated ESR 85, positive ANA, SSA, elevated ferritin, normal C-reactive protein  Immunofixation protein electrophoresis showed elevated total globulin, IgG, IgA, there was no M spike  Also evidence of vitamin D deficiency 20.8  She reported suboptimal symptom control with Cymbalta 60 mg daily and gabapentin 300 mg at bedtime add  no help  REVIEW OF SYSTEMS: Full 14 system review of systems performed and notable only for as above All other review of systems were  negative.  ALLERGIES: No Known Allergies  HOME MEDICATIONS: Current Outpatient Medications  Medication Sig Dispense Refill  . aspirin EC 81 MG tablet Take 81 mg by mouth as needed.     . Cholecalciferol (VITAMIN D3 PO) Take 1,000 Units by mouth daily.    . DULoxetine (CYMBALTA) 60 MG capsule TAKE 1 CAPSULE BY MOUTH EVERY DAY 90 capsule 2  . gabapentin (NEURONTIN) 300 MG capsule Take 300 mg by mouth at bedtime.    . meloxicam (MOBIC) 15 MG tablet Take 15 mg by mouth as needed for pain.     No current facility-administered medications for this visit.    PAST MEDICAL HISTORY: Past Medical History:  Diagnosis Date  . GERD (gastroesophageal reflux disease)   . Hyperglycemia   . Numbness     PAST SURGICAL HISTORY: Past Surgical History:  Procedure Laterality Date  . ABDOMINAL HYSTERECTOMY    . EYE SURGERY Left     FAMILY HISTORY: Family History  Problem Relation Age of Onset  . Healthy Mother   . Hypertension Father   . Diabetes Father     SOCIAL HISTORY: Social History   Socioeconomic History  . Marital status: Widowed    Spouse name: Not on file  . Number of children: 3  . Years of education: college  . Highest education level: Not on file  Occupational History  . Occupation: CMA  Tobacco Use  . Smoking status: Never Smoker  . Smokeless tobacco: Never Used  Substance and Sexual Activity  . Alcohol use: No  . Drug use: No  . Sexual activity: Not on file  Other Topics Concern  . Not on file  Social History Narrative   Lives with her  daughter.   Right-handed.   3-4 cups coffee/tea per day.   Social Determinants of Health   Financial Resource Strain:   . Difficulty of Paying Living Expenses:   Food Insecurity:   . Worried About Charity fundraiser in the Last Year:   . Arboriculturist in the Last Year:   Transportation Needs:   . Film/video editor (Medical):   Marland Kitchen Lack of Transportation (Non-Medical):   Physical Activity:   . Days of Exercise per  Week:   . Minutes of Exercise per Session:   Stress:   . Feeling of Stress :   Social Connections:   . Frequency of Communication with Friends and Family:   . Frequency of Social Gatherings with Friends and Family:   . Attends Religious Services:   . Active Member of Clubs or Organizations:   . Attends Archivist Meetings:   Marland Kitchen Marital Status:   Intimate Partner Violence:   . Fear of Current or Ex-Partner:   . Emotionally Abused:   Marland Kitchen Physically Abused:   . Sexually Abused:      PHYSICAL EXAM   There were no vitals filed for this visit. Not recorded     There is no height or weight on file to calculate BMI.   PHYSICAL EXAMNIATION:  Gen: NAD, conversant, well nourised, well groomed                     Cardiovascular: Regular rate rhythm, no peripheral edema, warm, nontender. Eyes: Conjunctivae clear without exudates or hemorrhage Neck: Supple, no carotid bruits. Pulmonary: Clear to auscultation bilaterally   NEUROLOGICAL EXAM:  MENTAL STATUS: Speech/Cognition: Awake, alert, normal speech, oriented to history taking and casual conversation.  CRANIAL NERVES: CN II: Visual fields are full to confrontation.  Pupils are round equal and briskly reactive to light. CN III, IV, VI: extraocular movement are normal. No ptosis. CN V: Facial sensation is intact to light touch. CN VII: Face is symmetric with normal eye closure and smile. CN VIII: Hearing is normal to casual conversation CN IX, X: Palate elevates symmetrically. Phonation is normal. CN XI: Head turning and shoulder shrug are intact CN XII: Tongue is midline with normal movements and no atrophy.  MOTOR: Muscle bulk and tone are normal. Muscle strength is normal.  REFLEXES: Reflexes are 2  and symmetric at the biceps, triceps, knees and ankles. Plantar responses are flexor.  SENSORY: Intact to light touch, pinprick, positional and vibratory sensation at fingers and toes.  COORDINATION: There is no  trunk or limb ataxia.    GAIT/STANCE: Posture is normal. Gait is steady with normal steps, base, arm swing and turning.    DIAGNOSTIC DATA (LABS, IMAGING, TESTING) - I reviewed patient records, labs, notes, testing and imaging myself where available.   ASSESSMENT AND PLAN  Julia James is a 64 y.o. female    Gradual onset bilateral lower extremity ascending paresthesia, and bilateral hands paresthesia   EMG nerve conduction study confirmed mild bilateral carpal tunnel syndromes, there was no evidence of large fiber peripheral neuropathy,  MRI of lumbar spine is pending,  Laboratory evaluation showed multiple abnormality, include increased inflammatory markers, ESR 85, positive SSA, ANA, IgG, will repeat laboratory evaluations,  If repeat laboratory confirmed significantly elevated ESR, differentiation diagnosis of her complaint of diffuse body achy pain also include polymyalgia rheumatica, may consider low-dose of steroid treatment   Vitamin D deficiency Vitamin D supplement 1000 units daily  Carpal tunnel  syndrome Wrist splint  Marcial Pacas, M.D. Ph.D.  [facility] [address], [place], [place] [address] Ph: [phone] Fax: [phone]  CC: Vernie Shanks, MD  Referring Physician

## 2019-11-15 ENCOUNTER — Telehealth: Payer: Self-pay | Admitting: Neurology

## 2019-11-15 NOTE — Telephone Encounter (Signed)
I attempted to call the patient again. Left another message.

## 2019-11-15 NOTE — Telephone Encounter (Signed)
Left message for a return call

## 2019-11-15 NOTE — Telephone Encounter (Addendum)
Please call patient, repeat laboratory evaluation continues show significantly elevated ESR 86, C-reactive protein, high-sensitivity was elevated 5.22,  This could indicate a systemic inflammatory process, such as polymyalgia rheumatica  We will try low-dose prednisone 5 mg, 4tablets every morning = 20 mg for 2 weeks, then 3 tablets= 15 mg every morning for 2 weeks, stay on 2 tablets every morning,  Give her follow-up with Sarah in 4 weeks  She has elevated A1c 6.0, she should be mindful about potential side effect of prednisone, eat healthy, moderate exercise,  Side effect from Prednisone Common  Cardiovascular: Hypertension  Endocrine metabolic: Body fluid retention, Increased appetite, And weight gain  Musculoskeletal: Osteoporosis  Psychiatric: Disturbance in mood

## 2019-11-16 MED ORDER — PREDNISONE 5 MG PO TABS
ORAL_TABLET | ORAL | 0 refills | Status: DC
Start: 2019-11-16 — End: 2020-12-25

## 2019-11-16 NOTE — Addendum Note (Signed)
Addended by: Lindell Spar C on: 11/16/2019 11:28 AM   Modules accepted: Orders

## 2019-11-16 NOTE — Telephone Encounter (Signed)
I spoke to the patient and she verbalized understanding of her lab results. She is agreeable to start Prednisone and understands the dosing schedule. Per vo by Dr. Terrace Arabia, she would like her to stay on this medication until her follow up with Sarah on 12/26/19.

## 2019-11-17 NOTE — Telephone Encounter (Signed)
[facility] has tried to reach out to the patient on 10/12/19 & 10/15/19 they left a voicemail.

## 2019-11-19 LAB — SJOGRENS SYNDROME-B EXTRACTABLE NUCLEAR ANTIBODY: ENA SSB (LA) Ab: <0.2 AI (ref 0.0–0.9)

## 2019-11-19 LAB — IMMUNOFIXATION ELECTROPHORESIS
IgA/Immunoglobulin A, Serum: 356 mg/dL — ABNORMAL HIGH (ref 87–352)
IgG (Immunoglobin G), Serum: 2468 mg/dL — ABNORMAL HIGH (ref 586–1602)
IgM (Immunoglobulin M), Srm: 52 mg/dL (ref 26–217)
Total Protein: 8.1 g/dL (ref 6.0–8.5)

## 2019-11-19 LAB — HIGH SENSITIVITY CRP: CRP, High Sensitivity: 5.22 mg/L — ABNORMAL HIGH (ref 0.00–3.00)

## 2019-11-19 LAB — SJOGRENS SYNDROME-A EXTRACTABLE NUCLEAR ANTIBODY: ENA SSA (RO) Ab: 6.5 AI — ABNORMAL HIGH (ref 0.0–0.9)

## 2019-11-19 LAB — SEDIMENTATION RATE: Sed Rate: 86 mm/hr — ABNORMAL HIGH (ref 0–40)

## 2019-11-19 LAB — C-REACTIVE PROTEIN: CRP: 5 mg/L (ref 0–10)

## 2019-11-20 ENCOUNTER — Telehealth: Payer: Self-pay | Admitting: Neurology

## 2019-11-20 MED ORDER — PREDNISONE 5 MG PO TABS: ORAL_TABLET | ORAL | 3 refills | Status: DC

## 2019-11-20 NOTE — Telephone Encounter (Signed)
Please call patient, laboratory evaluation showed elevated ESR 86,High-sensitivity C-reactive protein, SSA, elevated IgG,  Above findings could indicate systemic inflammatory process, with her complaints of diffuse body achy pain, could indicate possible diagnosis of polymyalgia rheumatica,  Will empirically treat her with prednisone 5 mg x4 tablets=63m  every morning after breakfast for 2 weeks, then 3 tablets= 50 mg every morning for 2 weeks, stay on 5 mg 2 tablets= 10 mg every morning until reevaluation   Meds ordered this encounter  Medications  . predniSONE (DELTASONE) 5 MG tablet    Sig: 4 tab qam x2 weeks; 3 tabs qamx2 weeks, then 2 tabs qam    Dispense:  120 tablet    Refill:  3

## 2019-11-20 NOTE — Telephone Encounter (Signed)
I spoke to the patient and provided her with these results on 11/16/19. She was in agreement to start the Prednisone. It was sent to the pharmacy.

## 2019-12-12 ENCOUNTER — Other Ambulatory Visit: Payer: PRIVATE HEALTH INSURANCE

## 2019-12-26 ENCOUNTER — Ambulatory Visit (INDEPENDENT_AMBULATORY_CARE_PROVIDER_SITE_OTHER): Payer: PRIVATE HEALTH INSURANCE | Admitting: Neurology

## 2019-12-26 ENCOUNTER — Encounter: Payer: Self-pay | Admitting: Neurology

## 2019-12-26 ENCOUNTER — Telehealth: Payer: Self-pay | Admitting: Neurology

## 2019-12-26 ENCOUNTER — Other Ambulatory Visit: Payer: Self-pay

## 2019-12-26 VITALS — BP 129/85 | HR 56 | Ht 62.0 in | Wt 224.0 lb

## 2019-12-26 DIAGNOSIS — R202 Paresthesia of skin: Secondary | ICD-10-CM | POA: Diagnosis not present

## 2019-12-26 NOTE — Progress Notes (Signed)
PATIENT: Julia James DOB: Oct 28, 1955  REASON FOR VISIT: follow up HISTORY FROM: patient  HISTORY OF PRESENT ILLNESS: Today 12/26/19  HISTORY Julia James is a 64 year old female, seen in request by her primary care physician Dr. Jacelyn Grip, Dub Mikes P for evaluation of bilateral feet paresthesia  I reviewed and summarized the referring note. She has past medical history of acid reflux, prediabetes, obesity,  Since February 2021, she noticed gradual onset bilateral feet numbness tingling, starting at the plantar surface, quickly ascending to bilateral ankle level, sometimes to knee level, she described numbness tingling, occasionally woke her up from sleep, she denies bowel and bladder incontinence, has chronic pain, but no significant radiating pain,  She also complains of bilateral hands paresthesia, was seen by hand surgeon, was given the diagnosis of possible carpal tunnel syndromes  UPDATE November 14 2019: She return for electrodiagnostic study today, which confirmed mild bilateral carpal tunnel syndromes, there was no evidence of large fiber peripheral neuropathy, or right lumbosacral radiculopathy   she complains of intermittent diffuse body achy pain, especially after bearing weight,  Laboratory evaluation showed significantly elevated ESR 85, positive ANA, SSA, elevated ferritin, normal C-reactive protein  Immunofixation protein electrophoresis showed elevated total globulin, IgG, IgA, there was no M spike  Also evidence of vitamin D deficiency 20.8  She reported suboptimal symptom control with Cymbalta 60 mg daily and gabapentin 300 mg at bedtime   Update December 26, 2019 SS: Repeat laboratory evaluation shows significantly elevated ESR 86, CRP high-sensitivity was elevated 5.22,, elevated IgG, could indicate systemic inflammatory process such as polymyalgia rheumatica, was placed on prednisone taper 5 mg, 20 mg x 2 week, 15 mg x 2 weeks, 10 mg, as stated  10 mg.  A1c 6.0.  Noted no change with prednisone, symptoms remain, tingling/burning to lower extremities, up to mid shin.  Notices at the end of the day, when sitting down, or sleeping.  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  Denies achy body pain, no falls or trouble with balance.  MRI of lumbar spine was not completed. Wants to know what caused the paresthesia to lower extremities, came out of nowhere.  REVIEW OF SYSTEMS: Out of a complete 14 system review of symptoms, the patient complains only of the following symptoms, and all other reviewed systems are negative.  Tingling  ALLERGIES: No Known Allergies  HOME MEDICATIONS: Outpatient Medications Prior to Visit  Medication Sig Dispense Refill  . aspirin EC 81 MG tablet Take 81 mg by mouth as needed.     . Cholecalciferol (VITAMIN D3 PO) Take 1,000 Units by mouth daily.    . DULoxetine (CYMBALTA) 60 MG capsule TAKE 1 CAPSULE BY MOUTH EVERY DAY 90 capsule 2  . meloxicam (MOBIC) 15 MG tablet Take 15 mg by mouth as needed for pain.    . predniSONE (DELTASONE) 5 MG tablet Take 4 tabs each morning x 14 days, then 3 tabs each morning x 14 days, then 2 tabs each morning thereafter. 126 tablet 0  . pregabalin (LYRICA) 100 MG capsule Take 1 capsule (100 mg total) by mouth 3 (three) times daily. 90 capsule 5  . predniSONE (DELTASONE) 5 MG tablet 4 tab qam x2 weeks; 3 tabs qamx2 weeks, then 2 tabs qam 120 tablet 3   No facility-administered medications prior to visit.    PAST MEDICAL HISTORY: Past Medical History:  Diagnosis Date  . GERD (gastroesophageal reflux disease)   . Hyperglycemia   . Numbness  PAST SURGICAL HISTORY: Past Surgical History:  Procedure Laterality Date  . ABDOMINAL HYSTERECTOMY    . EYE SURGERY Left     FAMILY HISTORY: Family History  Problem Relation Age of Onset  . Healthy Mother   . Hypertension Father   . Diabetes Father     SOCIAL HISTORY: Social History   Socioeconomic History  .  Marital status: Widowed    Spouse name: Not on file  . Number of children: 3  . Years of education: college  . Highest education level: Not on file  Occupational History  . Occupation: CMA  Tobacco Use  . Smoking status: Never Smoker  . Smokeless tobacco: Never Used  Substance and Sexual Activity  . Alcohol use: No  . Drug use: No  . Sexual activity: Not on file  Other Topics Concern  . Not on file  Social History Narrative   Lives with her daughter.   Right-handed.   3-4 cups coffee/tea per day.   Social Determinants of Health   Financial Resource Strain:   . Difficulty of Paying Living Expenses: Not on file  Food Insecurity:   . Worried About Charity fundraiser in the Last Year: Not on file  . Ran Out of Food in the Last Year: Not on file  Transportation Needs:   . Lack of Transportation (Medical): Not on file  . Lack of Transportation (Non-Medical): Not on file  Physical Activity:   . Days of Exercise per Week: Not on file  . Minutes of Exercise per Session: Not on file  Stress:   . Feeling of Stress : Not on file  Social Connections:   . Frequency of Communication with Friends and Family: Not on file  . Frequency of Social Gatherings with Friends and Family: Not on file  . Attends Religious Services: Not on file  . Active Member of Clubs or Organizations: Not on file  . Attends Archivist Meetings: Not on file  . Marital Status: Not on file  Intimate Partner Violence:   . Fear of Current or Ex-Partner: Not on file  . Emotionally Abused: Not on file  . Physically Abused: Not on file  . Sexually Abused: Not on file   PHYSICAL EXAM  Vitals:   12/26/19 1036  BP: 129/85  Pulse: (!) 56  Weight: 224 lb (101.6 kg)  Height: 5' 2"  (1.575 m)   Body mass index is 40.97 kg/m.  Generalized: Well developed, in no acute distress   Neurological examination  Mentation: Alert oriented to time, place, history taking. Follows all commands speech and language  fluent Cranial nerve II-XII: Pupils were equal round reactive to light. Extraocular movements were full, visual field were full on confrontational test. Facial sensation and strength were normal. Head turning and shoulder shrug  were normal and symmetric. Motor: The motor testing reveals 5 over 5 strength of all 4 extremities. Good symmetric motor tone is noted throughout. 2 + edema BLE Sensory: Sensory testing is intact to soft touch on all 4 extremities. No evidence of extinction is noted.  Coordination: Cerebellar testing reveals good finger-nose-finger and heel-to-shin bilaterally.  Gait and station: Gait is normal.  Able to walk on heels and tiptoe without problem. Reflexes: Deep tendon reflexes are symmetric and normal bilaterally.   DIAGNOSTIC DATA (LABS, IMAGING, TESTING) - I reviewed patient records, labs, notes, testing and imaging myself where available.  Lab Results  Component Value Date   WBC 4.0 08/26/2009   HGB 11.5 (L) 08/26/2009  HCT 33.9 (L) 08/26/2009   MCV 81.5 08/26/2009   PLT 142 (L) 08/26/2009      Component Value Date/Time   PROT 8.1 11/14/2019 0900   No results found for: CHOL, HDL, LDLCALC, LDLDIRECT, TRIG, CHOLHDL Lab Results  Component Value Date   HGBA1C 6.0 (H) 10/10/2019   Lab Results  Component Value Date   VITAMINB12 970 10/10/2019   No results found for: TSH  ASSESSMENT AND PLAN 64 y.o. year old female  has a past medical history of GERD (gastroesophageal reflux disease), Hyperglycemia, and Numbness. here with:  1.  Gradual onset bilateral lower extremity ascending paresthesia, bilateral hand paresthesia  -EMG/NCV confirmed mild bilateral carpal tunnel syndromes, no evidence of large fiber peripheral neuropathy  -Repeat laboratory evaluation showed elevated ESR 86, high-sensitivity CRP elevated, elevated IgG, will repeat ESR, CRP, IgA, IgG, IgM  -Noted no change with prednisone even at 20 mg daily, currently taking 10 mg daily, will wean  off 5 mg daily x 2 weeks then stop   -MRI of lumbar spine was ordered but has yet to be completed, will try to get this set up  -Denies benefit with Cymbalta or Lyrica, but patient is poor medication historian  -Follow-up in 2-3 months with Dr. Krista Blue or sooner if needed  I spent 30 minutes of face-to-face and non-face-to-face time with patient.  This included previsit chart review, lab review, study review, order entry, electronic health record documentation, patient education.  Butler Denmark, AGNP-C, DNP 12/26/2019, 11:17 AM [facility] [address], [place], [place] [address] [phone]

## 2019-12-26 NOTE — Patient Instructions (Signed)
Decrease prednisone to 5 mg daily x 2 weeks then stop  Check blood work today  Get you set up for MRI Lumbar spine  Return in 2-3 months with Dr. Terrace Arabia

## 2019-12-26 NOTE — Telephone Encounter (Signed)
Dr. Terrace Arabia has ordered MRI of Lumbar Spine in June. The patient is ready to have this done. Can we set this up please?

## 2019-12-26 NOTE — Telephone Encounter (Signed)
Noted, patient was scheduled at GI for 12/12/19 but she no showed that appt. I will resend the order to GI for them to reschedule the appt with her.

## 2019-12-27 LAB — IGG, IGA, IGM
IgA/Immunoglobulin A, Serum: 349 mg/dL (ref 87–352)
IgG (Immunoglobin G), Serum: 2319 mg/dL — ABNORMAL HIGH (ref 586–1602)
IgM (Immunoglobulin M), Srm: 62 mg/dL (ref 26–217)

## 2019-12-27 LAB — SEDIMENTATION RATE: Sed Rate: 61 mm/hr — ABNORMAL HIGH (ref 0–40)

## 2019-12-27 LAB — HIGH SENSITIVITY CRP: CRP, High Sensitivity: 4.45 mg/L — ABNORMAL HIGH (ref 0.00–3.00)

## 2020-02-12 ENCOUNTER — Emergency Department (HOSPITAL_COMMUNITY)
Admission: EM | Admit: 2020-02-12 | Discharge: 2020-02-12 | Disposition: A | Discharge status: Home / Self Care | Payer: PRIVATE HEALTH INSURANCE | Attending: Emergency Medicine | Admitting: Emergency Medicine

## 2020-02-12 ENCOUNTER — Encounter (HOSPITAL_COMMUNITY): Payer: Self-pay

## 2020-02-12 DIAGNOSIS — Z23 Encounter for immunization: Secondary | ICD-10-CM | POA: Insufficient documentation

## 2020-02-12 DIAGNOSIS — T23002A Burn of unspecified degree of left hand, unspecified site, initial encounter: Secondary | ICD-10-CM | POA: Insufficient documentation

## 2020-02-12 DIAGNOSIS — T22012A Burn of unspecified degree of left forearm, initial encounter: Secondary | ICD-10-CM | POA: Diagnosis not present

## 2020-02-12 DIAGNOSIS — X102XXA Contact with fats and cooking oils, initial encounter: Secondary | ICD-10-CM | POA: Diagnosis not present

## 2020-02-12 DIAGNOSIS — Z7982 Long term (current) use of aspirin: Secondary | ICD-10-CM | POA: Diagnosis not present

## 2020-02-12 DIAGNOSIS — T23001A Burn of unspecified degree of right hand, unspecified site, initial encounter: Secondary | ICD-10-CM | POA: Diagnosis present

## 2020-02-12 DIAGNOSIS — T300 Burn of unspecified body region, unspecified degree: Secondary | ICD-10-CM

## 2020-02-12 DIAGNOSIS — T22011A Burn of unspecified degree of right forearm, initial encounter: Secondary | ICD-10-CM | POA: Insufficient documentation

## 2020-02-12 MED ORDER — HYDROCODONE-ACETAMINOPHEN 5-325 MG PO TABS
1.0000 | ORAL_TABLET | ORAL | 0 refills | Status: DC | PRN
Start: 2020-02-12 — End: 2020-12-25

## 2020-02-12 MED ORDER — OXYCODONE-ACETAMINOPHEN 5-325 MG PO TABS
1.0000 | ORAL_TABLET | Freq: Once | ORAL | Status: AC
  Administered 2020-02-12: 1 via ORAL
  Filled 2020-02-12: qty 1

## 2020-02-12 MED ORDER — FENTANYL CITRATE (PF) 100 MCG/2ML IJ SOLN
100.0000 ug | Freq: Once | INTRAMUSCULAR | Status: AC
  Administered 2020-02-12: 100 ug via INTRAVENOUS
  Filled 2020-02-12: qty 2

## 2020-02-12 MED ORDER — TETANUS-DIPHTH-ACELL PERTUSSIS 5-2.5-18.5 LF-MCG/0.5 IM SUSP
0.5000 mL | Freq: Once | INTRAMUSCULAR | Status: AC
  Administered 2020-02-12: 0.5 mL via INTRAMUSCULAR
  Filled 2020-02-12: qty 0.5

## 2020-02-12 MED ORDER — SODIUM CHLORIDE 0.9 % IV BOLUS
1000.0000 mL | Freq: Once | INTRAVENOUS | Status: AC
  Administered 2020-02-12: 1000 mL via INTRAVENOUS

## 2020-02-12 NOTE — ED Notes (Signed)
Ice bath prepared anda pt soaking bilateral arms at this time.
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
## 2020-02-12 NOTE — ED Notes (Signed)
Discharge instructions discussed with pt. Pt was able to verbalize understanding with no questions at this time. Pt to go home with family at bedside. Provided bandages and xeroform for home use
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
## 2020-03-26 ENCOUNTER — Ambulatory Visit: Payer: PRIVATE HEALTH INSURANCE | Admitting: Neurology

## 2020-12-23 ENCOUNTER — Telehealth: Payer: Self-pay | Admitting: Neurology

## 2020-12-23 NOTE — Telephone Encounter (Signed)
Pt called, leg pain has gotten worse. Would like a call from the nurse to discuss a sooner appt than 12/8.

## 2020-12-23 NOTE — Telephone Encounter (Signed)
Her appt was moved to an earlier date. She will be coming on 12/25/20.

## 2020-12-25 ENCOUNTER — Ambulatory Visit: Payer: Commercial Managed Care - PPO | Admitting: Neurology

## 2020-12-25 ENCOUNTER — Encounter: Payer: Self-pay | Admitting: Neurology

## 2020-12-25 VITALS — BP 122/78 | HR 70 | Ht 63.0 in | Wt 221.5 lb

## 2020-12-25 DIAGNOSIS — R52 Pain, unspecified: Secondary | ICD-10-CM | POA: Diagnosis not present

## 2020-12-25 DIAGNOSIS — R202 Paresthesia of skin: Secondary | ICD-10-CM | POA: Diagnosis not present

## 2020-12-25 MED ORDER — DULOXETINE HCL 60 MG PO CPEP: 60.0000 mg | ORAL_CAPSULE | Freq: Every day | ORAL | 11 refills | Status: DC

## 2020-12-25 NOTE — Progress Notes (Signed)
ASSESSMENT AND PLAN 65 y.o.    Diffuse body achy pain  Especially bilateral anterior shin, shoulder region  Laboratory evaluation in September 2021 showed significantly elevated ESR up to 85 C-reactive protein hypersensitivity 5.22, suggestive of polymyalgia rheumatica, she was treated with short course of steroid, denies significant improvement, stopped taking it, also complains of lower extremity paresthesia  The EMG nerve conduction study in July 2021 showed no large fiber peripheral neuropathy, there is evidence of mild bilateral carpal tunnel syndromes Will repeat inflammatory marker, Try Cymbalta 60 mg daily  She also complains long history of low back pain, bilateral lower extremity paresthesia MRI of lumbar spine to rule out structural abnormality   HISTORY OF PRESENT ILLNESS: Julia James is a 65 year old female, seen in request by her primary care physician Dr. Jacelyn Grip, [place] P for evaluation of bilateral feet paresthesia   I reviewed and summarized the referring note. She has past medical history of acid reflux, prediabetes, obesity,   Since February 2021, she noticed gradual onset bilateral feet numbness tingling, starting at the plantar surface, quickly ascending to bilateral ankle level, sometimes to knee level, she described numbness tingling, occasionally woke her up from sleep, she denies bowel and bladder incontinence, has chronic pain, but no significant radiating pain,   She also complains of bilateral hands paresthesia, was seen by hand surgeon, was given the diagnosis of possible carpal tunnel syndromes   UPDATE November 14 2019: She return for electrodiagnostic study today, which confirmed mild bilateral carpal tunnel syndromes, there was no evidence of large fiber peripheral neuropathy, or right lumbosacral radiculopathy    she complains of intermittent diffuse body achy pain, especially after bearing weight,   Laboratory evaluation showed significantly  elevated ESR 85, positive ANA, SSA, elevated ferritin, normal C-reactive protein   Immunofixation protein electrophoresis showed elevated total globulin, IgG, IgA, there was no M spike   Also evidence of vitamin D deficiency 20.8   She reported suboptimal symptom control with Cymbalta 60 mg daily and gabapentin 300 mg at bedtime   UPDATE Sept 1 2022: She returns with similar complaints, bilateral lower extremity deep achy pain especially at bilateral anterior shin region, she has to wrap them to make her feel better, bilateral shoulder region deep achy pain, previously had a short course of prednisone, tapering from 20 mg, she denies significant improvement, she did have elevated ESR up to 86, C-reactive protein  She denies significant neck pain, has chronic low back pain, bilateral lower extremity paresthesia  She was able to walk as a CNA, third shift, sometimes double shift, required heavy lifting, tried Cymbalta for short period of time, was not sure about the benefit   REVIEW OF SYSTEMS: Out of a complete 14 system review of symptoms, the patient complains only of the following symptoms, and all other reviewed systems are negative.  Tingling  ALLERGIES: No Known Allergies  HOME MEDICATIONS: Outpatient Medications Prior to Visit  Medication Sig Dispense Refill   aspirin EC 81 MG tablet Take 81 mg by mouth as needed.      Cholecalciferol (VITAMIN D3 PO) Take 1,000 Units by mouth daily.     DULoxetine (CYMBALTA) 60 MG capsule TAKE 1 CAPSULE BY MOUTH EVERY DAY 90 capsule 2   HYDROcodone-acetaminophen (NORCO/VICODIN) 5-325 MG tablet Take 1 tablet by mouth every 4 (four) hours as needed. 6 tablet 0   meloxicam (MOBIC) 15 MG tablet Take 15 mg by mouth as needed for pain.     predniSONE (DELTASONE) 5  MG tablet Take 4 tabs each morning x 14 days, then 3 tabs each morning x 14 days, then 2 tabs each morning thereafter. 126 tablet 0   pregabalin (LYRICA) 100 MG capsule Take 1 capsule (100  mg total) by mouth 3 (three) times daily. 90 capsule 5   No facility-administered medications prior to visit.    PAST MEDICAL HISTORY: Past Medical History:  Diagnosis Date   GERD (gastroesophageal reflux disease)    Hyperglycemia    Numbness     PAST SURGICAL HISTORY: Past Surgical History:  Procedure Laterality Date   ABDOMINAL HYSTERECTOMY     EYE SURGERY Left     FAMILY HISTORY: Family History  Problem Relation Age of Onset   Healthy Mother    Hypertension Father    Diabetes Father     SOCIAL HISTORY: Social History   Socioeconomic History   Marital status: Widowed    Spouse name: Not on file   Number of children: 3   Years of education: college   Highest education level: Not on file  Occupational History   Occupation: CMA  Tobacco Use   Smoking status: Never   Smokeless tobacco: Never  Substance and Sexual Activity   Alcohol use: No   Drug use: No   Sexual activity: Not on file  Other Topics Concern   Not on file  Social History Narrative   Lives with her daughter.   Right-handed.   3-4 cups coffee/tea per day.   Social Determinants of Health   Financial Resource Strain: Not on file  Food Insecurity: Not on file  Transportation Needs: Not on file  Physical Activity: Not on file  Stress: Not on file  Social Connections: Not on file  Intimate Partner Violence: Not on file   PHYSICAL EXAM  Vitals:   12/25/20 1512  BP: 122/78  Pulse: 70  Weight: 221 lb 8 oz (100.5 kg)  Height: 5' 3"  (1.6 m)   Body mass index is 39.24 kg/m.  Generalized: Well developed, in no acute distress   Neurological examination  Mentation: Alert oriented to time, place, history taking. Follows all commands speech and language fluent Cranial nerve II-XII: Pupils were equal round reactive to light. Extraocular movements were full, visual field were full on confrontational test. Facial sensation and strength were normal. Head turning and shoulder shrug  were normal and  symmetric. Motor: The motor testing reveals 5 over 5 strength of all 4 extremities. Good symmetric motor tone is noted throughout. 2 + edema BLE Sensory: Sensory testing is intact to soft touch on all 4 extremities. No evidence of extinction is noted.  Coordination: Cerebellar testing reveals good finger-nose-finger and heel-to-shin bilaterally.  Gait and station: Gait is normal.  Able to walk on heels and tiptoe without problem. Reflexes: Deep tendon reflexes are symmetric and normal bilaterally.   DIAGNOSTIC DATA (LABS, IMAGING, TESTING) - I reviewed patient records, labs, notes, testing and imaging myself where available.  Lab Results  Component Value Date   WBC 4.0 08/26/2009   HGB 11.5 (L) 08/26/2009   HCT 33.9 (L) 08/26/2009   MCV 81.5 08/26/2009   PLT 142 (L) 08/26/2009      Component Value Date/Time   PROT 8.1 11/14/2019 0900   No results found for: CHOL, HDL, LDLCALC, LDLDIRECT, TRIG, CHOLHDL Lab Results  Component Value Date   HGBA1C 6.0 (H) 10/10/2019   Lab Results  Component Value Date   VITAMINB12 970 10/10/2019   No results found for: TSH

## 2020-12-26 LAB — CBC WITH DIFFERENTIAL/PLATELET
Basophils Absolute: 0 10*3/uL (ref 0.0–0.2)
Basos: 1 %
EOS (ABSOLUTE): 0.4 10*3/uL (ref 0.0–0.4)
Eos: 10 %
Hematocrit: 38.2 % (ref 34.0–46.6)
Hemoglobin: 12.4 g/dL (ref 11.1–15.9)
Immature Grans (Abs): 0 10*3/uL (ref 0.0–0.1)
Immature Granulocytes: 0 %
Lymphocytes Absolute: 1.5 10*3/uL (ref 0.7–3.1)
Lymphs: 39 %
MCH: 26.3 pg — ABNORMAL LOW (ref 26.6–33.0)
MCHC: 32.5 g/dL (ref 31.5–35.7)
MCV: 81 fL (ref 79–97)
Monocytes Absolute: 0.5 10*3/uL (ref 0.1–0.9)
Monocytes: 12 %
Neutrophils Absolute: 1.4 10*3/uL (ref 1.4–7.0)
Neutrophils: 38 %
Platelets: 157 10*3/uL (ref 150–450)
RBC: 4.72 x10E6/uL (ref 3.77–5.28)
RDW: 13.4 % (ref 11.7–15.4)
WBC: 3.8 10*3/uL (ref 3.4–10.8)

## 2020-12-26 LAB — COMPREHENSIVE METABOLIC PANEL
ALT: 13 IU/L (ref 0–32)
AST: 21 IU/L (ref 0–40)
Albumin/Globulin Ratio: 1.1 — ABNORMAL LOW (ref 1.2–2.2)
Albumin: 3.9 g/dL (ref 3.8–4.8)
Alkaline Phosphatase: 66 IU/L (ref 44–121)
BUN/Creatinine Ratio: 22 (ref 12–28)
BUN: 18 mg/dL (ref 8–27)
Bilirubin Total: 0.3 mg/dL (ref 0.0–1.2)
CO2: 25 mmol/L (ref 20–29)
Calcium: 9.4 mg/dL (ref 8.7–10.3)
Chloride: 105 mmol/L (ref 96–106)
Creatinine, Ser: 0.82 mg/dL (ref 0.57–1.00)
Globulin, Total: 3.5 g/dL (ref 1.5–4.5)
Glucose: 89 mg/dL (ref 65–99)
Potassium: 3.9 mmol/L (ref 3.5–5.2)
Sodium: 140 mmol/L (ref 134–144)
Total Protein: 7.4 g/dL (ref 6.0–8.5)
eGFR: 80 mL/min/{1.73_m2} (ref >59)

## 2020-12-26 LAB — ANA W/REFLEX IF POSITIVE
Anti JO-1: <0.2 AI (ref 0.0–0.9)
Anti Nuclear Antibody (ANA): POSITIVE — AB
Centromere Ab Screen: <0.2 AI (ref 0.0–0.9)
Chromatin Ab SerPl-aCnc: <0.2 AI (ref 0.0–0.9)
ENA RNP Ab: 0.4 AI (ref 0.0–0.9)
ENA SM Ab Ser-aCnc: <0.2 AI (ref 0.0–0.9)
ENA SSA (RO) Ab: 4.4 AI — ABNORMAL HIGH (ref 0.0–0.9)
ENA SSB (LA) Ab: <0.2 AI (ref 0.0–0.9)
Scleroderma (Scl-70) (ENA) Antibody, IgG: <0.2 AI (ref 0.0–0.9)
dsDNA Ab: <1 IU/mL (ref 0–9)

## 2020-12-26 LAB — THYROID PANEL WITH TSH
Free Thyroxine Index: 1.8 (ref 1.2–4.9)
T3 Uptake Ratio: 23 % — ABNORMAL LOW (ref 24–39)
T4, Total: 7.7 ug/dL (ref 4.5–12.0)
TSH: 1.54 u[IU]/mL (ref 0.450–4.500)

## 2020-12-26 LAB — VITAMIN B12: Vitamin B-12: 1233 pg/mL (ref 232–1245)

## 2020-12-26 LAB — RPR: RPR Ser Ql: NONREACTIVE

## 2020-12-26 LAB — CK: Total CK: 194 U/L — ABNORMAL HIGH (ref 32–182)

## 2020-12-26 LAB — SEDIMENTATION RATE: Sed Rate: 53 mm/hr — ABNORMAL HIGH (ref 0–40)

## 2020-12-26 LAB — VITAMIN D 25 HYDROXY (VIT D DEFICIENCY, FRACTURES): Vit D, 25-Hydroxy: 29.7 ng/mL — ABNORMAL LOW (ref 30.0–100.0)

## 2020-12-26 LAB — C-REACTIVE PROTEIN: CRP: 3 mg/L (ref 0–10)

## 2020-12-26 LAB — HGB A1C W/O EAG: Hgb A1c MFr Bld: 6 % — ABNORMAL HIGH (ref 4.8–5.6)

## 2020-12-30 ENCOUNTER — Telehealth: Payer: Self-pay | Admitting: Neurology

## 2020-12-30 NOTE — Telephone Encounter (Signed)
Left message requesting a call back to discuss results. 

## 2020-12-30 NOTE — Telephone Encounter (Signed)
Spoke to pt discussed results, pt states she is doing well with Cymbalta, reports less pain and aches.  She would like to continue medication and will call us to discuss steroid options if symptoms worsen. All questions answered, pt voiced understanding.

## 2020-12-30 NOTE — Telephone Encounter (Signed)
Please call patient, laboratory evaluation showed elevated ESR 53, with normal C-reactive protein, ESR has been persistently elevated,  Also positive ANA, with positive SSA  A1C 6.0.  Above finding could indicate polymyalgia rheumatica, patient usually presented with diffuse body achy pain  She has prediabetes now, ask her how she responds to Cymbalta, if Cymbalta has helped her symptoms, may continue current dose,  If she still has significant body achy pain, want to try something else, may consider steroid again, which can potentially worsening her diabetes

## 2021-01-06 ENCOUNTER — Telehealth: Payer: Self-pay | Admitting: Neurology

## 2021-01-06 NOTE — Telephone Encounter (Signed)
Patient stated that the medication is not working and she didn't go into any details with me, she just stated its not working.

## 2021-01-06 NOTE — Telephone Encounter (Signed)
She works as a Lawyer. She is having bilateral leg pain, worse with prolonged sitting. Better when up and walking around. She is currently taking duloxetine 60mg , one capsule daily. She does not wish to restart steroids and would like to know if there is another medication option.

## 2021-01-06 NOTE — Telephone Encounter (Signed)
MR Lumbar spine wo contrast Dr. Binnie Kand Berkley Harvey: NPR spoke to [facility] Ref # [id number]. Patient is scheduled at [facility] for 01/13/21.

## 2021-01-08 MED ORDER — PREGABALIN 75 MG PO CAPS: 75.0000 mg | ORAL_CAPSULE | Freq: Three times a day (TID) | ORAL | 5 refills | Status: DC

## 2021-01-08 NOTE — Telephone Encounter (Signed)
Meds ordered this encounter  Medications   pregabalin (LYRICA) 75 MG capsule    Sig: Take 1 capsule (75 mg total) by mouth 3 (three) times daily.    Dispense:  90 capsule    Refill:  5

## 2021-01-08 NOTE — Addendum Note (Signed)
Addended by: Lindell Spar C on: 01/08/2021 03:20 PM   Modules accepted: Orders

## 2021-01-08 NOTE — Telephone Encounter (Signed)
I spoke to the patient. She does not feel duloxetine 60mg  daily has been helpful. She is going to stop the medication. She is agreeable to try pregabalin 75mg , one cap TID. She will titrate up to the prescribe dosage, as tolerated.

## 2021-01-08 NOTE — Addendum Note (Signed)
Addended by: Levert Feinstein on: 01/08/2021 01:16 PM   Modules accepted: Orders

## 2021-01-13 ENCOUNTER — Ambulatory Visit: Payer: Commercial Managed Care - PPO

## 2021-01-13 ENCOUNTER — Ambulatory Visit
Admission: RE | Admit: 2021-01-13 | Discharge: 2021-01-13 | Disposition: A | Discharge status: Home / Self Care | Payer: Commercial Managed Care - PPO | Source: Ambulatory Visit | Attending: Neurology | Admitting: Neurology

## 2021-01-13 ENCOUNTER — Other Ambulatory Visit: Payer: Self-pay

## 2021-01-13 ENCOUNTER — Telehealth: Payer: Self-pay | Admitting: Neurology

## 2021-01-13 DIAGNOSIS — R202 Paresthesia of skin: Secondary | ICD-10-CM

## 2021-01-13 NOTE — Telephone Encounter (Signed)
Patient r/s for 01/20/21 she needed to get an xray of the orbits first.

## 2021-01-13 NOTE — Telephone Encounter (Signed)
Orders Placed This Encounter  Procedures   DG Eye Foreign Body     Prior to MRI brain

## 2021-01-20 ENCOUNTER — Other Ambulatory Visit: Payer: Self-pay

## 2021-01-20 ENCOUNTER — Ambulatory Visit (INDEPENDENT_AMBULATORY_CARE_PROVIDER_SITE_OTHER): Payer: Commercial Managed Care - PPO

## 2021-01-20 DIAGNOSIS — R202 Paresthesia of skin: Secondary | ICD-10-CM | POA: Diagnosis not present

## 2021-01-20 DIAGNOSIS — R52 Pain, unspecified: Secondary | ICD-10-CM

## 2021-01-22 ENCOUNTER — Telehealth: Payer: Self-pay | Admitting: Neurology

## 2021-01-22 DIAGNOSIS — M48061 Spinal stenosis, lumbar region without neurogenic claudication: Secondary | ICD-10-CM

## 2021-01-22 NOTE — Telephone Encounter (Signed)
Called and discussed Dr. Zannie Cove review and findings regarding patient's MRI study of the lumbar spine.  Patient is aware of Dr. Zannie Cove recommendation for a neurosurgeon consult for decompression.  Patient was agreeable to recommendation.    Patient denied further questions, verbalized understanding and expressed appreciation for the phone call.

## 2021-01-22 NOTE — Telephone Encounter (Addendum)
  MRI lumbar spine (without) demonstrating; - At L4-5: disc bulging and facet hypertrophy with severe spinal stenosis, mild right and severe left foraminal stenosis. - At L5-S1: disc bulging and facet hypertrophy with moderate spinal stenosis and mild biforaminal stenosis. - At L3-4: disc bulging and facet hypertrophy with mild spinal stenosis and mild biforaminal stenosis.  Please call patient, MRI of lumbar spine showed severe spinal stenosis at L4-5 level, moderate L5-S1,  This will explain her frequent complaints of bilateral lower extremity pain, I will refer her to neurosurgeon for decompression surgery,  If she has a lot of questions about imaging findings, may give her all follow-up appointment with me to review MRI findings

## 2021-01-23 ENCOUNTER — Telehealth: Payer: Self-pay | Admitting: Neurology

## 2021-01-23 NOTE — Telephone Encounter (Signed)
Sent referral to [facility] ph # [phone].  

## 2021-04-02 ENCOUNTER — Ambulatory Visit: Payer: PRIVATE HEALTH INSURANCE | Admitting: Neurology

## 2021-04-16 ENCOUNTER — Emergency Department (HOSPITAL_COMMUNITY): Payer: Commercial Managed Care - PPO

## 2021-04-16 ENCOUNTER — Other Ambulatory Visit: Payer: Self-pay

## 2021-04-16 ENCOUNTER — Emergency Department (HOSPITAL_COMMUNITY)
Admission: EM | Admit: 2021-04-16 | Discharge: 2021-04-16 | Disposition: A | Discharge status: Home / Self Care | Payer: Commercial Managed Care - PPO | Attending: Emergency Medicine | Admitting: Emergency Medicine

## 2021-04-16 DIAGNOSIS — S61012A Laceration without foreign body of left thumb without damage to nail, initial encounter: Secondary | ICD-10-CM

## 2021-04-16 DIAGNOSIS — Z23 Encounter for immunization: Secondary | ICD-10-CM | POA: Diagnosis not present

## 2021-04-16 DIAGNOSIS — W260XXA Contact with knife, initial encounter: Secondary | ICD-10-CM | POA: Diagnosis not present

## 2021-04-16 DIAGNOSIS — S6992XA Unspecified injury of left wrist, hand and finger(s), initial encounter: Secondary | ICD-10-CM | POA: Diagnosis present

## 2021-04-16 MED ORDER — LIDOCAINE HCL (PF) 1 % IJ SOLN: 5.0000 mL | Freq: Once | INTRAMUSCULAR | Status: AC

## 2021-04-16 MED ORDER — ACETAMINOPHEN 325 MG PO TABS
650.0000 mg | ORAL_TABLET | Freq: Once | ORAL | Status: AC
  Administered 2021-04-16: 19:00:00 650 mg via ORAL
  Filled 2021-04-16: qty 2

## 2021-04-16 MED ORDER — TETANUS-DIPHTH-ACELL PERTUSSIS 5-2.5-18.5 LF-MCG/0.5 IM SUSY
0.5000 mL | PREFILLED_SYRINGE | Freq: Once | INTRAMUSCULAR | Status: AC
  Administered 2021-04-16: 19:00:00 0.5 mL via INTRAMUSCULAR
  Filled 2021-04-16 (×2): qty 0.5

## 2021-04-16 MED ORDER — LIDOCAINE-EPINEPHRINE (PF) 2 %-1:200000 IJ SOLN: 10.0000 mL | Freq: Once | INTRAMUSCULAR | Status: DC

## 2021-04-16 MED ORDER — LIDOCAINE HCL (PF) 1 % IJ SOLN
INTRAMUSCULAR | Status: AC
  Administered 2021-04-16: 20:00:00 5 mL via INTRADERMAL
  Filled 2021-04-16: qty 30

## 2021-04-16 NOTE — ED Notes (Signed)
Patient transported to X-ray 

## 2021-04-16 NOTE — ED Notes (Signed)
Pt name called for triage, no response 

## 2021-04-16 NOTE — ED Notes (Signed)
The skin on the anterior of the left 1st digit of hand is lacerated. There's a flap of skin just laying on the finger. Bleeding is controlled.

## 2021-04-16 NOTE — Progress Notes (Signed)
Orthopedic Tech Progress Note Patient Details:  Julia James May 28, 1955 [id number]  Ortho Devices Type of Ortho Device: Finger splint Ortho Device/Splint Location: Left thumb Ortho Device/Splint Interventions: Ordered, Application, Adjustment   Post Interventions Patient Tolerated: Fair Instructions Provided: Care of device, Adjustment of device  [place] A Gerilyn Pilgrim 04/16/2021, 8:41 PM

## 2021-04-16 NOTE — ED Triage Notes (Signed)
Pt cut L thumb with kitchen knife just PTA. Unknown last tetanus shot.

## 2021-04-16 NOTE — ED Provider Notes (Signed)
Emergency Medicine Provider Triage Evaluation Note  Julia James , a 65 y.o. female  was evaluated in triage.  Pt complains of laceration to left thumb.  Laceration occurred just prior to arrival.  Patient states that she excellently only cut herself with a kitchen knife.  Patient complains of pain to laceration site.  Is unsure when her last tetanus shot was.  Patient is right-hand dominant  Review of Systems  Positive: Wound Negative: Numbness, weakness, color change  Physical Exam  There were no vitals taken for this visit. Gen:   Awake, no distress   Resp:  Normal effort  MSK:   Moves extremities without difficulty  Other:  Laceration to left thumb over IP joint.  Sensation intact to all aspects of left thumb.  Patient has range of motion to left thumb.  Medical Decision Making  Medically screening exam initiated at 5:53 PM.  Appropriate orders placed.  Julia James was informed that the remainder of the evaluation will be completed by another provider, this initial triage assessment does not replace that evaluation, and the importance of remaining in the ED until their evaluation is complete.  [place] ordered.  Will obtain x-ray imaging   Julia James 04/16/21 1755    Julia Score, MD 04/16/21 [phone]

## 2021-04-16 NOTE — ED Provider Notes (Signed)
[facility] EMERGENCY DEPARTMENT Provider Note   CSN: [id number] Arrival date & time: 04/16/21  1630     History Chief Complaint  Patient presents with   Finger Injury    Julia James is a 65 y.o. female with past medical history significant for GERD who presents after accidentally cutting her finger with a knife.  The patient was cutting something in the kitchen when she accidentally sliced her left thumb with a knife.  She is unsure of her last tetanus shot.  She presented to the emergency department for continued care and management.  Past Medical History:  Diagnosis Date   GERD (gastroesophageal reflux disease)    Hyperglycemia    Numbness     Patient Active Problem List   Diagnosis Date Noted   Spinal stenosis of lumbar region 01/22/2021   Total body pain 12/25/2020   Bilateral carpal tunnel syndrome 11/14/2019   Bilateral low back pain with bilateral sciatica 10/10/2019   Paresthesia 10/10/2019    Past Surgical History:  Procedure Laterality Date   ABDOMINAL HYSTERECTOMY     EYE SURGERY Left      OB History   No obstetric history on file.     Family History  Problem Relation Age of Onset   Healthy Mother    Hypertension Father    Diabetes Father     Social History   Tobacco Use   Smoking status: Never   Smokeless tobacco: Never  Substance Use Topics   Alcohol use: No   Drug use: No    Home Medications Prior to Admission medications   Medication Sig Start Date End Date Taking? Authorizing Provider  pregabalin (LYRICA) 75 MG capsule Take 1 capsule (75 mg total) by mouth 3 (three) times daily. 01/08/21   Levert Feinstein, MD    Allergies    Patient has no known allergies.  Review of Systems   Review of Systems  Constitutional:  Negative for chills and fever.  Respiratory:  Negative for shortness of breath.   Cardiovascular:  Negative for chest pain.  Gastrointestinal:  Negative for abdominal pain and vomiting.  Skin:   Positive for wound. Negative for rash.  All other systems reviewed and are negative.  Physical Exam Updated Vital Signs BP 125/81    Pulse 71    Temp 97.6 F (36.4 C) (Oral)    Resp 16    Ht 5\' 4"  (1.626 m)    Wt 99.8 kg    SpO2 99%    BMI 37.76 kg/m   Physical Exam Vitals and nursing note reviewed.  Constitutional:      General: She is not in acute distress.    Appearance: She is well-developed.  HENT:     Head: Normocephalic and atraumatic.     Right Ear: External ear normal.     Left Ear: External ear normal.     Nose: Nose normal.     Mouth/Throat:     Mouth: Mucous membranes are moist.     Pharynx: Oropharynx is clear.  Eyes:     Extraocular Movements: Extraocular movements intact.     Conjunctiva/sclera: Conjunctivae normal.     Pupils: Pupils are equal, round, and reactive to light.  Cardiovascular:     Rate and Rhythm: Normal rate and regular rhythm.     Heart sounds: No murmur heard. Pulmonary:     Effort: Pulmonary effort is normal. No respiratory distress.     Breath sounds: Normal breath sounds.  Abdominal:  Palpations: Abdomen is soft.     Tenderness: There is no abdominal tenderness.  Musculoskeletal:        General: Signs of injury (oblique laceration overlying DIP of left thumb as pictured below) present. No swelling.     Cervical back: Neck supple.  Skin:    General: Skin is warm and dry.     Capillary Refill: Capillary refill takes less than 2 seconds.  Neurological:     Mental Status: She is alert and oriented to person, place, and time.  Psychiatric:        Mood and Affect: Mood normal.      ED Results / Procedures / Treatments   Labs (all labs ordered are listed, but only abnormal results are displayed) Labs Reviewed - No data to display  EKG None  Radiology DG Finger Thumb Left  Result Date: 04/16/2021 CLINICAL DATA:  laceration EXAM: LEFT THUMB 2+V COMPARISON:  None. FINDINGS: There is no evidence of fracture or dislocation. There  is no evidence of arthropathy or other focal bone abnormality. Soft tissue defect along the first digit. No retained radiopaque foreign body. Vascular calcifications. IMPRESSION: 1. No acute displaced fracture or dislocation. 2. No retained radiopaque foreign body. Electronically Signed   By: Tish Frederickson M.D.   On: 04/16/2021 19:01    Procedures .Marland KitchenLaceration Repair  Date/Time: 04/16/2021 5:00 PM Performed by: Janese Banks, MD Authorized by: Charlynne Pander, MD   Consent:    Consent obtained:  Verbal   Consent given by:  Patient   Risks, benefits, and alternatives were discussed: yes     Risks discussed:  Infection, pain, retained foreign body, poor cosmetic result and poor wound healing Universal protocol:    Procedure explained and questions answered to patient or proxy's satisfaction: yes     Patient identity confirmed:  Verbally with patient Anesthesia:    Anesthesia method:  Local infiltration   Local anesthetic:  Lidocaine 1% w/o epi Laceration details:    Location:  Finger   Finger location:  L thumb   Length (cm):  3 Pre-procedure details:    Preparation:  Patient was prepped and draped in usual sterile fashion and imaging obtained to evaluate for foreign bodies Exploration:    Imaging obtained: x-ray     Imaging outcome: foreign body not noted     Wound exploration: entire depth of wound visualized     Wound extent: no foreign bodies/material noted, no nerve damage noted, no tendon damage noted, no underlying fracture noted and no vascular damage noted     Contaminated: no   Treatment:    Area cleansed with:  Povidone-iodine   Amount of cleaning:  Standard   Irrigation solution:  Sterile saline   Irrigation method:  Syringe   Visualized foreign bodies/material removed: no     Debridement:  None   Undermining:  None Skin repair:    Repair method:  Sutures   Suture size:  4-0   Wound skin closure material used: ethilon.   Suture technique:  Simple  interrupted   Number of sutures:  4 Approximation:    Approximation:  Close Repair type:    Repair type:  Simple Post-procedure details:    Dressing:  Splint for protection, antibiotic ointment and tube gauze   Procedure completion:  Tolerated   Medications Ordered in ED Medications  Tdap (BOOSTRIX) injection 0.5 mL (0.5 mLs Intramuscular Given 04/16/21 1831)  acetaminophen (TYLENOL) tablet 650 mg (650 mg Oral Given 04/16/21 1833)  lidocaine (  PF) (XYLOCAINE) 1 % injection 5 mL (5 mLs Intradermal Given by Other 04/16/21 2020)    ED Course  I have reviewed the triage vital signs and the nursing notes.  Pertinent labs & imaging results that were available during my care of the patient were reviewed by me and considered in my medical decision making (see chart for details).    MDM Rules/Calculators/A&P                          No foreign body identified on XR.  Wound was thoroughly irrigated and repaired according to the procedure note above.  The patient tolerated the procedure well.  Recommended follow-up in 10 days for wound check and suture removal.  Splint was applied to patient's thumb for protection and to prevent disruption of sutures with thumb flexion.  Patient's tetanus was updated.  Discharge instructions and return precautions were discussed with the patient prior to discharge and included in the AVS.  She voiced understanding of these instructions.  The patient was then discharged in stable condition.   Final Clinical Impression(s) / ED Diagnoses Final diagnoses:  Laceration of left thumb without foreign body without damage to nail, initial encounter    Rx / DC Orders ED Discharge Orders     None        Jyoti Harju, Davy Pique, MD 04/17/21 1013    Charlynne Pander, MD 04/17/21 1700

## 2021-04-16 NOTE — Discharge Instructions (Signed)
Follow up with your PCP or an urgent care in 10-14 days to have your wound checked and sutures removed

## 2021-07-13 ENCOUNTER — Other Ambulatory Visit: Payer: Self-pay | Admitting: Neurology

## 2022-03-01 ENCOUNTER — Other Ambulatory Visit: Payer: Self-pay | Admitting: Neurology

## 2022-06-15 ENCOUNTER — Other Ambulatory Visit (HOSPITAL_BASED_OUTPATIENT_CLINIC_OR_DEPARTMENT_OTHER): Payer: Self-pay | Admitting: Family Medicine

## 2022-06-15 ENCOUNTER — Other Ambulatory Visit: Payer: Self-pay | Admitting: Family Medicine

## 2022-06-15 DIAGNOSIS — Z780 Asymptomatic menopausal state: Secondary | ICD-10-CM

## 2022-11-25 ENCOUNTER — Ambulatory Visit
Admission: RE | Admit: 2022-11-25 | Discharge: 2022-11-25 | Disposition: A | Discharge status: Home / Self Care | Payer: Commercial Managed Care - PPO | Source: Ambulatory Visit | Attending: Family Medicine | Admitting: Family Medicine

## 2022-11-25 DIAGNOSIS — Z780 Asymptomatic menopausal state: Secondary | ICD-10-CM

## 2022-12-23 IMAGING — CR DG ORBITS FOR FOREIGN BODY
2 series · 2 of 2 positions shown · non-contrast
Comparison: None.

CLINICAL DATA: Pre MRI screening

EXAM:
ORBITS FOR FOREIGN BODY - 2 VIEW

[w orbit pa (1 of 2)]
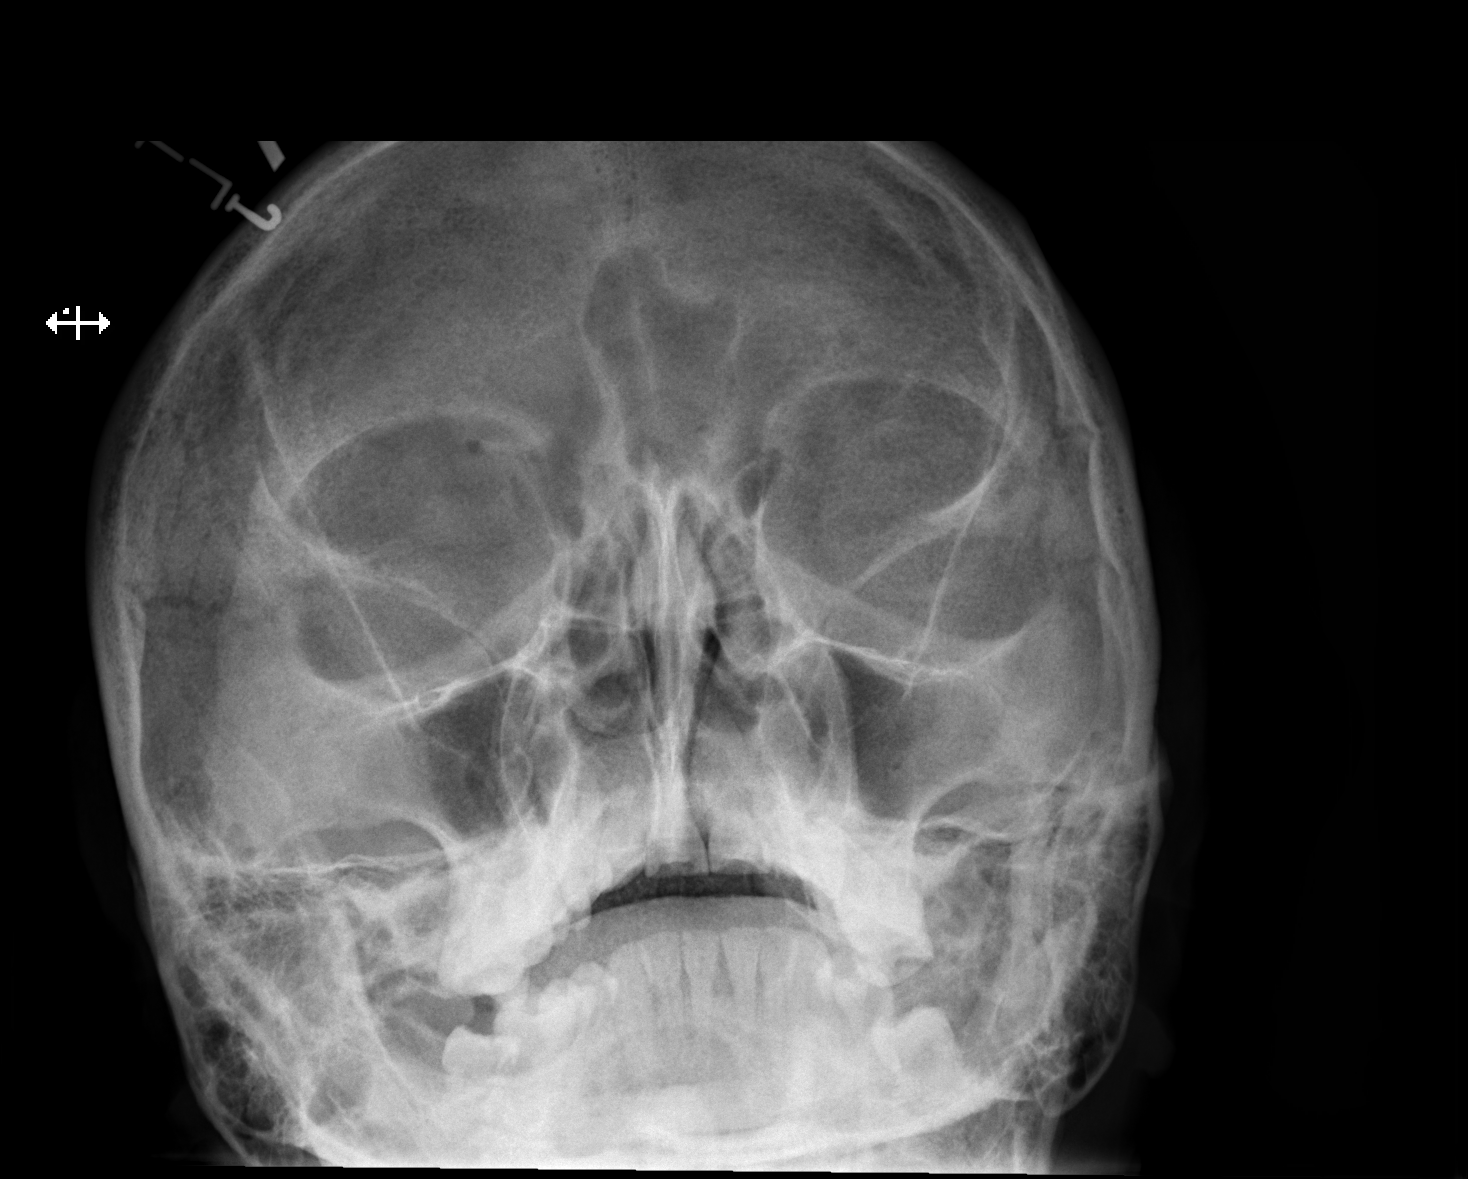

[w orbit pa (2 of 2)]
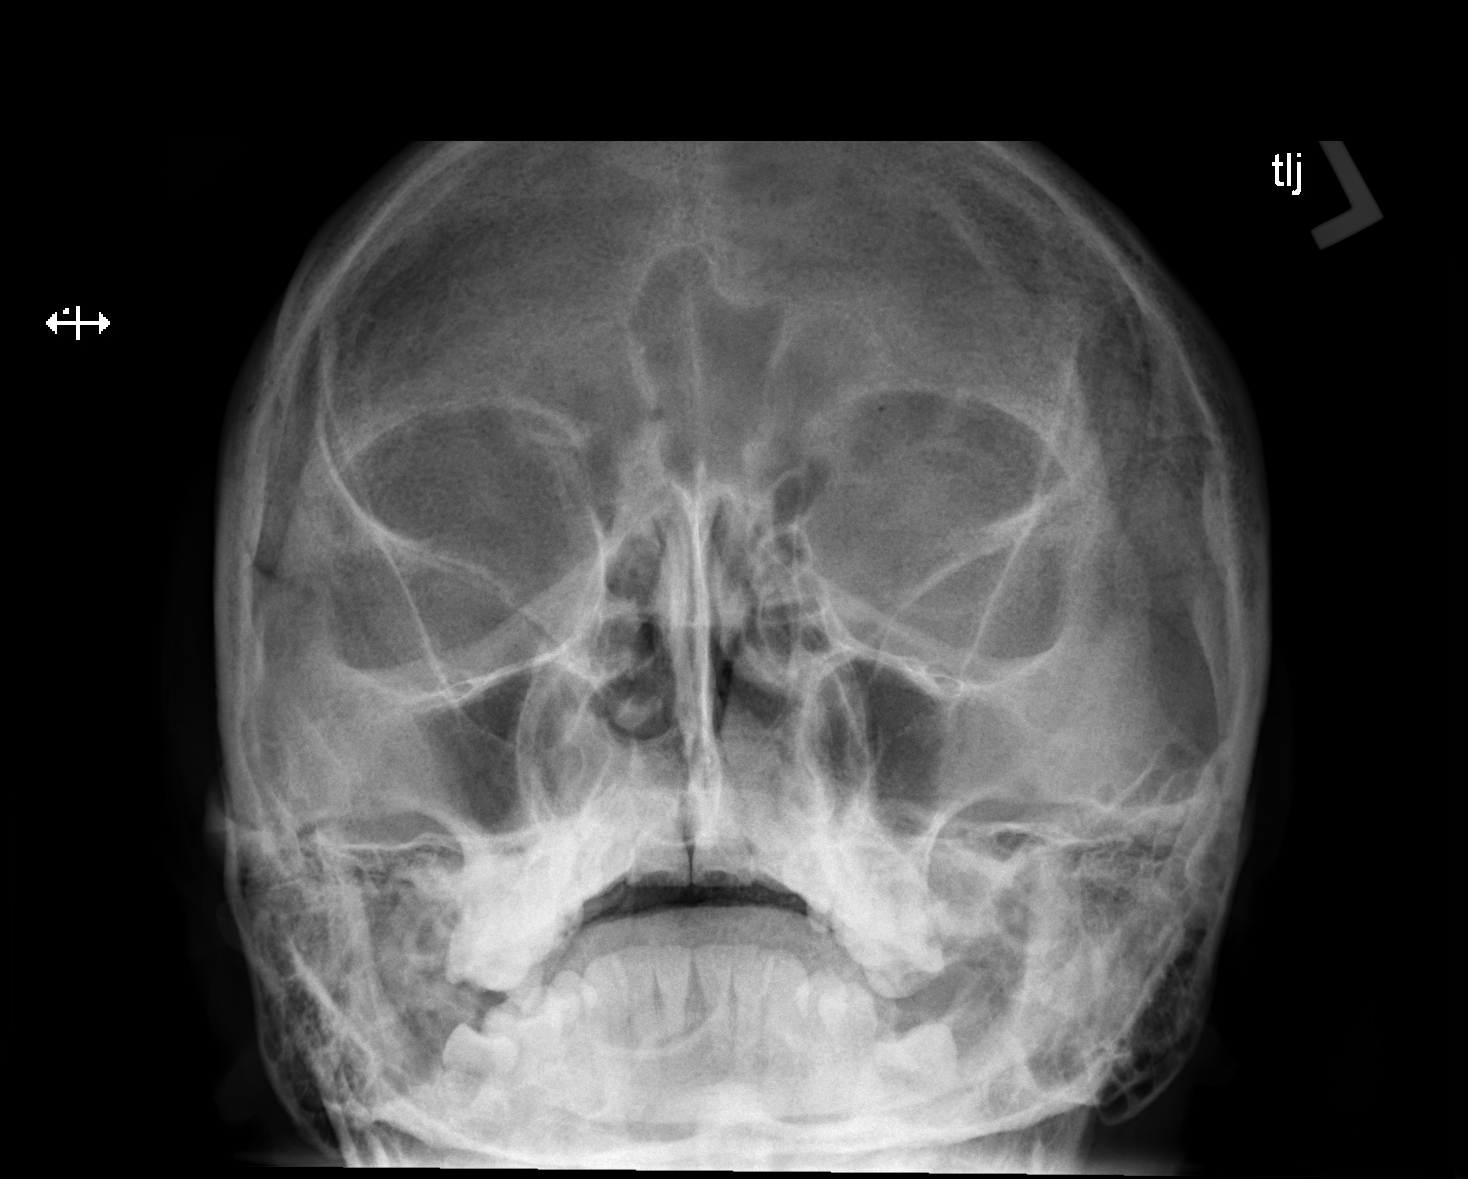

[2 of 2 positions shown; findings below may reference images not displayed]

FINDINGS: There is no evidence of metallic foreign body within the orbits. No
significant bone abnormality identified.
IMPRESSION: No evidence of metallic foreign body within the orbits.

## 2023-06-23 ENCOUNTER — Other Ambulatory Visit: Payer: Self-pay | Admitting: Family Medicine

## 2023-06-23 DIAGNOSIS — E2839 Other primary ovarian failure: Secondary | ICD-10-CM

## 2023-09-27 DIAGNOSIS — H524 Presbyopia: Secondary | ICD-10-CM | POA: Diagnosis not present

## 2023-11-03 DIAGNOSIS — M79605 Pain in left leg: Secondary | ICD-10-CM | POA: Diagnosis not present

## 2023-11-22 DIAGNOSIS — G4733 Obstructive sleep apnea (adult) (pediatric): Secondary | ICD-10-CM | POA: Diagnosis not present
